# Patient Record
Sex: Female | Born: 1945 | Race: White | Hispanic: No | Smoking: Former smoker
Health system: Southern US, Community
[De-identification: ages and names within clinical notes are randomized; demographics above are authoritative.]

## PROBLEM LIST (undated history)

## (undated) DIAGNOSIS — I10 Essential (primary) hypertension: Secondary | ICD-10-CM

## (undated) DIAGNOSIS — M24569 Contracture, unspecified knee: Secondary | ICD-10-CM

## (undated) DIAGNOSIS — I2510 Atherosclerotic heart disease of native coronary artery without angina pectoris: Secondary | ICD-10-CM

## (undated) DIAGNOSIS — E559 Vitamin D deficiency, unspecified: Secondary | ICD-10-CM

## (undated) DIAGNOSIS — L89159 Pressure ulcer of sacral region, unspecified stage: Secondary | ICD-10-CM

## (undated) DIAGNOSIS — G8310 Monoplegia of lower limb affecting unspecified side: Secondary | ICD-10-CM

## (undated) DIAGNOSIS — M810 Age-related osteoporosis without current pathological fracture: Secondary | ICD-10-CM

## (undated) DIAGNOSIS — N3946 Mixed incontinence: Secondary | ICD-10-CM

## (undated) DIAGNOSIS — R29898 Other symptoms and signs involving the musculoskeletal system: Secondary | ICD-10-CM

## (undated) DIAGNOSIS — I639 Cerebral infarction, unspecified: Secondary | ICD-10-CM

## (undated) DIAGNOSIS — S82309A Unspecified fracture of lower end of unspecified tibia, initial encounter for closed fracture: Secondary | ICD-10-CM

## (undated) DIAGNOSIS — E7800 Pure hypercholesterolemia, unspecified: Secondary | ICD-10-CM

## (undated) DIAGNOSIS — I350 Nonrheumatic aortic (valve) stenosis: Secondary | ICD-10-CM

## (undated) DIAGNOSIS — S82839A Other fracture of upper and lower end of unspecified fibula, initial encounter for closed fracture: Secondary | ICD-10-CM

## (undated) DIAGNOSIS — G35 Multiple sclerosis: Secondary | ICD-10-CM

## (undated) HISTORY — DX: Contracture, unspecified knee: M24.569

## (undated) HISTORY — DX: Cerebral infarction, unspecified: I63.9

## (undated) HISTORY — DX: Age-related osteoporosis without current pathological fracture: M81.0

## (undated) HISTORY — DX: Unspecified fracture of lower end of unspecified tibia, initial encounter for closed fracture: S82.309A

## (undated) HISTORY — DX: Atherosclerotic heart disease of native coronary artery without angina pectoris: I25.10

## (undated) HISTORY — DX: Multiple sclerosis: G35

## (undated) HISTORY — DX: Other symptoms and signs involving the musculoskeletal system: R29.898

## (undated) HISTORY — PX: CORONARY STENT PLACEMENT: SHX1402

## (undated) HISTORY — DX: Essential (primary) hypertension: I10

## (undated) HISTORY — DX: Other fracture of upper and lower end of unspecified fibula, initial encounter for closed fracture: S82.839A

## (undated) HISTORY — DX: Nonrheumatic aortic (valve) stenosis: I35.0

## (undated) HISTORY — DX: Monoplegia of lower limb affecting unspecified side: G83.10

## (undated) HISTORY — DX: Pressure ulcer of sacral region, unspecified stage: L89.159

## (undated) HISTORY — DX: Pure hypercholesterolemia, unspecified: E78.00

## (undated) HISTORY — DX: Vitamin D deficiency, unspecified: E55.9

## (undated) HISTORY — DX: Mixed incontinence: N39.46

---

## 1989-11-24 DIAGNOSIS — G35 Multiple sclerosis: Secondary | ICD-10-CM

## 1989-11-24 HISTORY — DX: Multiple sclerosis: G35

## 2006-05-14 ENCOUNTER — Ambulatory Visit: Payer: Self-pay | Admitting: Obstetrics and Gynecology

## 2006-12-16 ENCOUNTER — Encounter: Payer: Self-pay | Admitting: General Practice

## 2006-12-25 ENCOUNTER — Encounter: Payer: Self-pay | Admitting: General Practice

## 2007-01-23 ENCOUNTER — Encounter: Payer: Self-pay | Admitting: General Practice

## 2007-02-23 ENCOUNTER — Encounter: Payer: Self-pay | Admitting: General Practice

## 2007-03-16 ENCOUNTER — Emergency Department: Payer: Self-pay | Admitting: Emergency Medicine

## 2007-03-25 ENCOUNTER — Emergency Department: Payer: Self-pay | Admitting: Emergency Medicine

## 2007-03-28 ENCOUNTER — Emergency Department: Payer: Self-pay | Admitting: General Practice

## 2007-06-07 ENCOUNTER — Encounter: Payer: Self-pay | Admitting: Orthopaedic Surgery

## 2007-06-25 ENCOUNTER — Encounter: Payer: Self-pay | Admitting: Orthopaedic Surgery

## 2007-08-30 IMAGING — CR DG SHOULDER 1V*L*
1 series · 1 of 1 positions shown · non-contrast
Comparison: none

REASON FOR EXAM: trauma  pain in shoulder  [HOSPITAL]
COMMENTS:

[view not recorded]
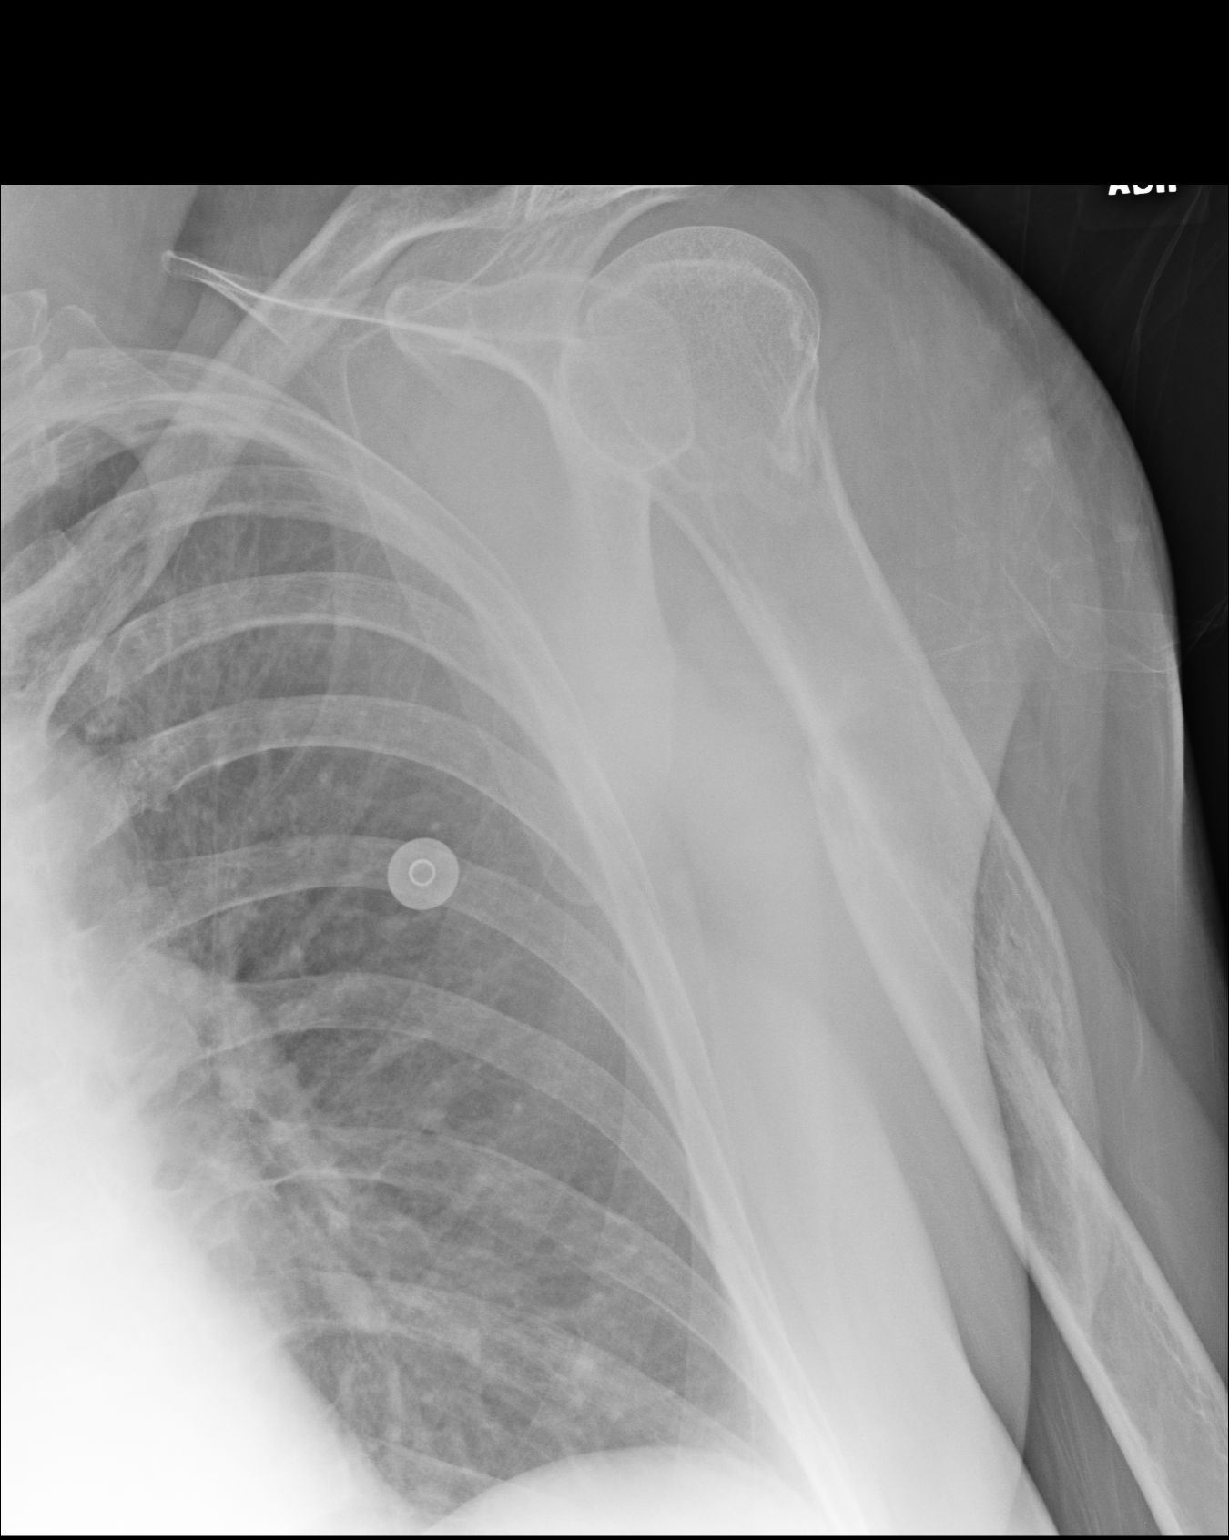

[1 of 1 positions shown; findings below may reference images not displayed]

PROCEDURE:     DXR - DXR SHOULDER LEFT ONE VIEW  - March 16, 2007  [DATE]

RESULT:     Views of the LEFT shoulder show diffuse osteopenia. Only a
single view is obtained. There is deformity in the midportion of the LEFT
humerus consistent with fracture. The chronicity of this and the degree of
healing is difficult to assess on this single view. Additional views, at
least a transthoracic lateral, would be helpful. The humeral head appears to
be intact and located in the glenoid.
IMPRESSION: 1.Deformity of the humerus. Please see above.

## 2007-08-30 IMAGING — CR DG HUMERUS 2V *L*
1 series · 2 of 2 positions shown · non-contrast
Comparison: none

REASON FOR EXAM: pain
COMMENTS:

[Series 1: view not recorded · 0.17mm/px · 2 of 2 slices shown]
[im 1/2]
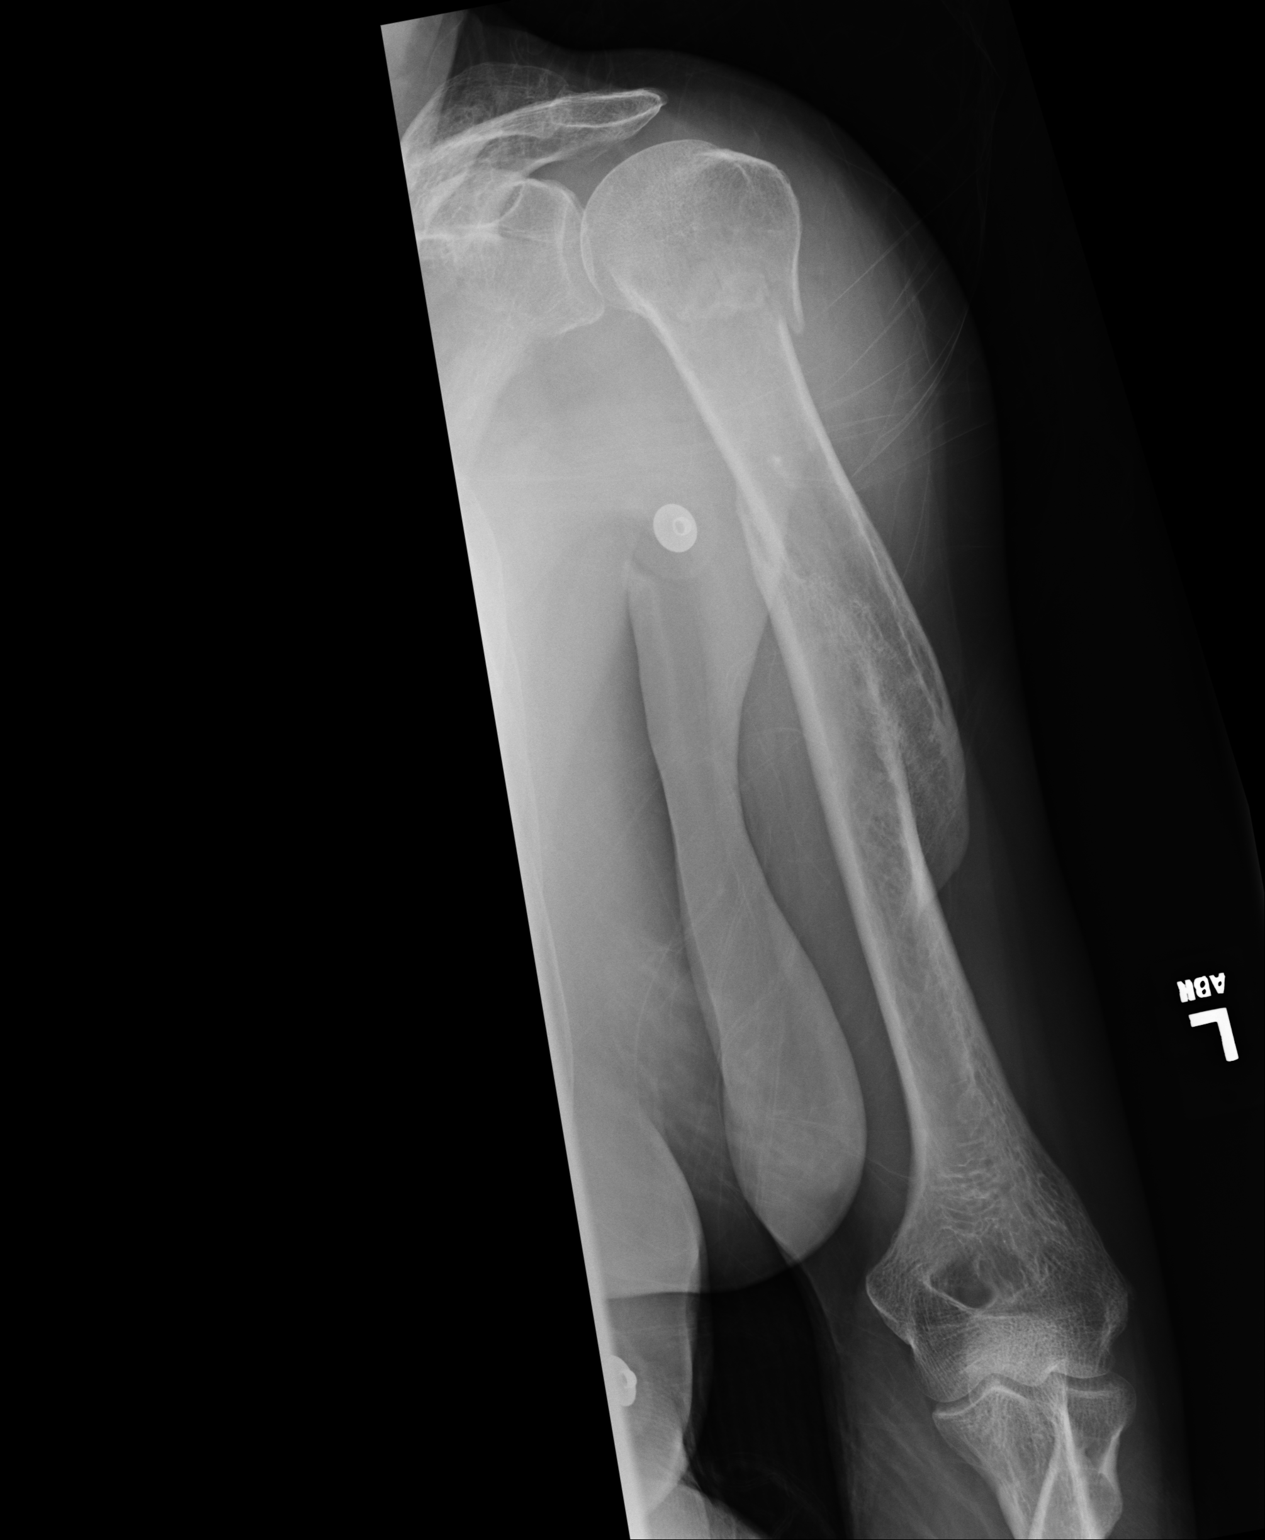
[im 2/2]
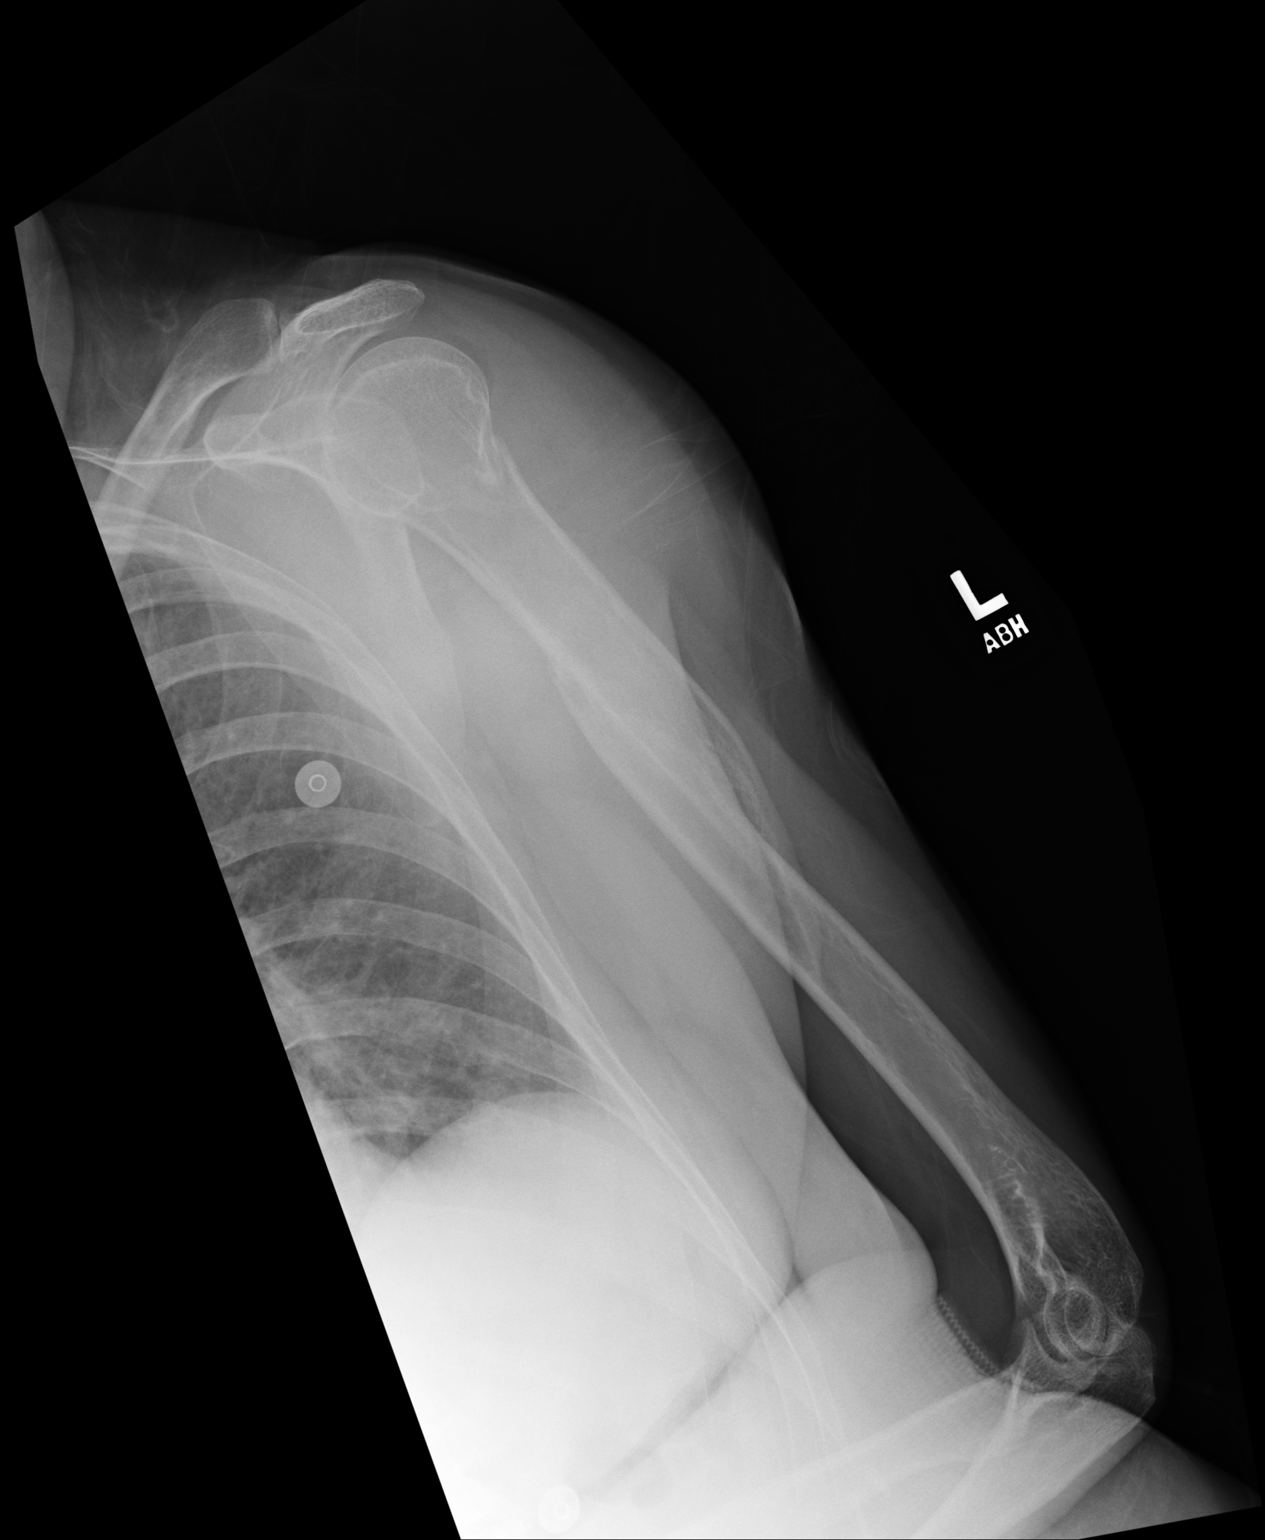

[2 of 2 positions shown; findings below may reference images not displayed]

PROCEDURE:     DXR - DXR HUMERUS LEFT  - March 16, 2007  [DATE]

RESULT:     Two views of the LEFT humerus show a fracture in the surgical
neck of the LEFT humerus. There is deformity of the midshaft which may be
secondary to old fracture with healing. Previous films on 04/28/1998 showed
healed old fracture in the midshaft of the LEFT humerus.
IMPRESSION: 1.Non-displaced proximal humeral fracture with old healed midshaft LEFT
humeral fracture. No significant comminution or angulation is present. No
significant impaction is demonstrated.

## 2010-01-25 ENCOUNTER — Ambulatory Visit: Payer: Self-pay | Admitting: Family Medicine

## 2012-11-12 ENCOUNTER — Ambulatory Visit: Payer: Self-pay | Admitting: Family Medicine

## 2012-12-08 ENCOUNTER — Ambulatory Visit: Payer: Self-pay | Admitting: Family Medicine

## 2013-04-25 ENCOUNTER — Ambulatory Visit: Payer: Self-pay | Admitting: Surgery

## 2013-11-24 DIAGNOSIS — I639 Cerebral infarction, unspecified: Secondary | ICD-10-CM

## 2013-11-24 HISTORY — DX: Cerebral infarction, unspecified: I63.9

## 2014-03-03 IMAGING — US ULTRASOUND RIGHT BREAST
1 series · 2 of 2 positions shown · non-contrast
Comparison: none

REASON FOR EXAM: av rt focal asymmetry
COMMENTS:

PROCEDURE:     US  - US BREAST RIGHT  - December 08, 2012 [DATE]
RESULT:     TECHNIQUE: Digital diagnostic right mammograms were obtained.
FDA approved computer-aided detection (CAD) for mammography was utilized for
this study.

[Series 1: ultrasound right breast · 0.08mm/px · 2 of 2 slices shown]
[im 1/2]
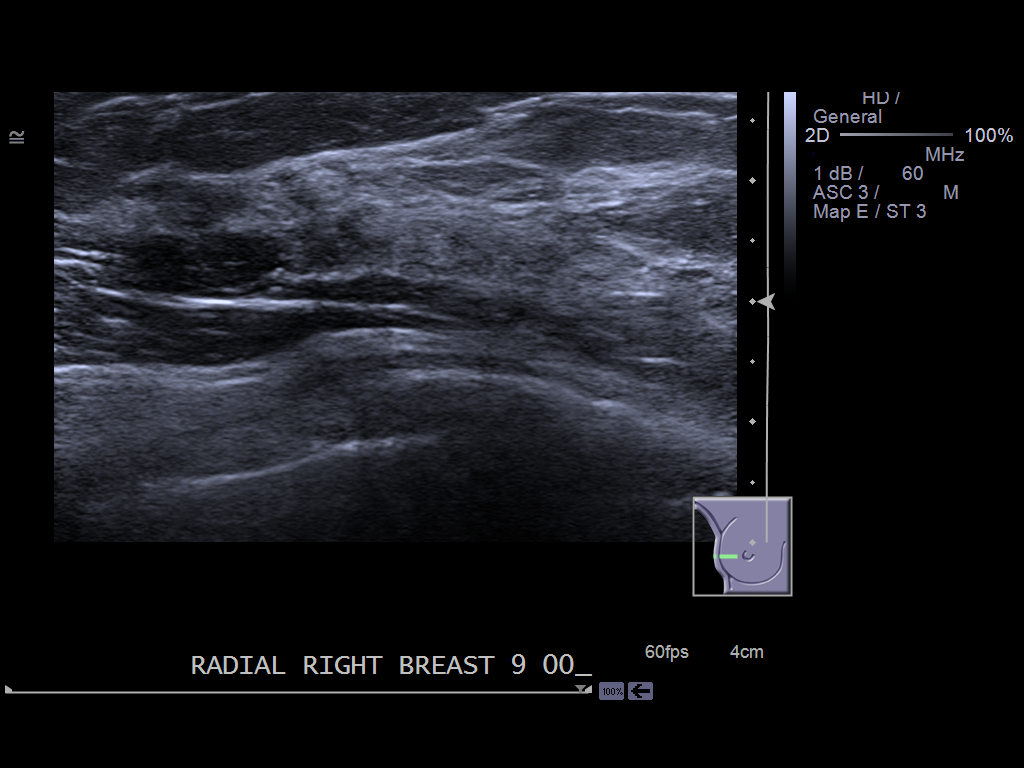
[im 2/2]
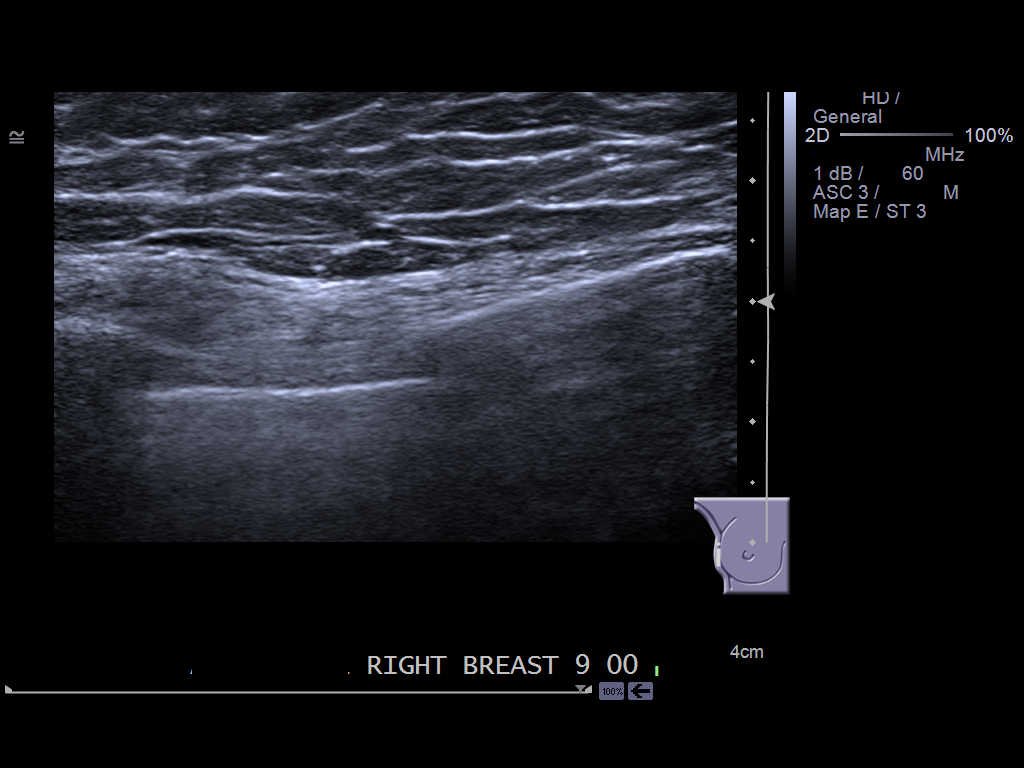

[2 of 2 positions shown; findings below may reference images not displayed]

FINDING: True lateral view and spot compression views of the right breast were
performed. There is a focal nodular asymmetry in the upper outer posterior
third of the right breast which is new compared with 01/25/2010. There is no
architectural distortion or clusters of suspicious microcalcifications.

Real-time sonography of the upper outer right breast was performed. There is
no solid or cystic mass.
IMPRESSION: 1. Small focal nodular asymmetry in the upper outer posterior third of the
right breast new compared with 01/25/2010. There is no sonographic correlate.
Recommend tissue diagnosis with stereotactic biopsy.

BI-RADS: Category 4 - Suspicious Abnormality

A negative mammogram report does not preclude biopsy or other evaluation of
a clinically palpable or otherwise suspicious mass or lesion. Breast cancer
may not be detected by mammography in up to 10% of cases.

[REDACTED]

## 2014-04-10 DIAGNOSIS — M810 Age-related osteoporosis without current pathological fracture: Secondary | ICD-10-CM

## 2014-04-10 DIAGNOSIS — I10 Essential (primary) hypertension: Secondary | ICD-10-CM | POA: Insufficient documentation

## 2014-04-10 DIAGNOSIS — E559 Vitamin D deficiency, unspecified: Secondary | ICD-10-CM

## 2014-04-10 DIAGNOSIS — E7800 Pure hypercholesterolemia, unspecified: Secondary | ICD-10-CM

## 2014-04-10 HISTORY — DX: Vitamin D deficiency, unspecified: E55.9

## 2014-04-10 HISTORY — DX: Essential (primary) hypertension: I10

## 2014-04-10 HISTORY — DX: Pure hypercholesterolemia, unspecified: E78.00

## 2014-04-10 HISTORY — DX: Age-related osteoporosis without current pathological fracture: M81.0

## 2014-06-06 ENCOUNTER — Ambulatory Visit: Payer: Self-pay | Admitting: Surgery

## 2014-07-11 DIAGNOSIS — G35 Multiple sclerosis: Secondary | ICD-10-CM | POA: Insufficient documentation

## 2014-07-11 DIAGNOSIS — R29898 Other symptoms and signs involving the musculoskeletal system: Secondary | ICD-10-CM | POA: Insufficient documentation

## 2014-07-11 HISTORY — DX: Other symptoms and signs involving the musculoskeletal system: R29.898

## 2014-09-13 DIAGNOSIS — M24569 Contracture, unspecified knee: Secondary | ICD-10-CM

## 2014-09-13 DIAGNOSIS — G8310 Monoplegia of lower limb affecting unspecified side: Secondary | ICD-10-CM

## 2014-09-13 HISTORY — DX: Monoplegia of lower limb affecting unspecified side: G83.10

## 2014-09-13 HISTORY — DX: Contracture, unspecified knee: M24.569

## 2015-04-25 ENCOUNTER — Ambulatory Visit: Payer: Medicare Other | Attending: Neurology

## 2015-04-25 DIAGNOSIS — G35 Multiple sclerosis: Secondary | ICD-10-CM | POA: Insufficient documentation

## 2015-04-25 DIAGNOSIS — R262 Difficulty in walking, not elsewhere classified: Secondary | ICD-10-CM | POA: Insufficient documentation

## 2015-04-25 DIAGNOSIS — M6281 Muscle weakness (generalized): Secondary | ICD-10-CM

## 2015-04-25 NOTE — Therapy (Addendum)
[place] MAIN [facility] SERVICES [address] [place], [place], [address] Phone: [phone]   Fax:  [phone]  Physical Therapy Evaluation  Patient Details  Name: Hannah Hines MRN: [id number] Date of Birth: 09/23/1946 Referring Provider:  Anabel Bene, MD  Encounter Date: 04/25/2015      PT End of Session - 04/25/15 1610    Visit Number 1   Number of Visits 1   Date for PT Re-Evaluation 05/02/15   PT Start Time 9024   PT Stop Time 1520   PT Time Calculation (min) 75 min   Equipment Utilized During Treatment Gait belt   Activity Tolerance Patient tolerated treatment well   Behavior During Therapy [facility] for tasks assessed/performed      Past Medical History  Diagnosis Date  . Multiple sclerosis 1991  . Hypertension   . Stroke 2015    questionable    Past Surgical History  Procedure Laterality Date  . Coronary stent placement N/A     There were no vitals filed for this visit.  Visit Diagnosis:  Difficulty walking  Muscle weakness      Subjective Assessment - 04/25/15 1559    Subjective pt reports she was diagnosed with Multiple sclerosis in 1991, but she was independent with mobility in the home using a cane up until a year ago. she reports she fell and broke her ankle, and then fell again in the shower around 6 months ago. pt reports since then she has had poor strength and control of the RLE and RUE. pt reports the doctors thinks she may have had a stroke at that time as she has right sided weakness. pt reports since then she has not been able to walk at all and has been confided to a chair/bed in her home. pt reports she can transfer with a great deal of UE support, but still falls at times. pt reports she has an old power chair of her mothers that she has used in the past but it is broken. pt reports she has a manual transport chair at home that got 6 months ago, but she cannot propel it well enough to get around in her home  adequately.    Patient is accompained by: Family member  daughter shann   Limitations Sitting;Standing;Walking;House hold activities   Patient Stated Goals maximize independence   Currently in Pain? Yes   Pain Score 5    Pain Location Arm   Pain Orientation Right;Left   Pain Descriptors / Indicators Aching   Pain Type Chronic pain            [facility] PT Assessment - 04/25/15 0001    Assessment   Medical Diagnosis MS   Onset Date/Surgical Date 04/24/90   Hand Dominance Right   Next MD Visit 06/2015   Prior Therapy [id number] PT   Precautions   Precautions Fall   Balance Screen   Has the patient fallen in the past 6 months Yes   How many times? 3   Has the patient had a decrease in activity level because of a fear of falling?  Yes   Is the patient reluctant to leave their home because of a fear of falling?  Yes   [place] entrance  chair [place] Two level   Alternate Level Stairs-Number  of Steps --  12- pt has chair [place] - single point;Wheelchair - [employer] - 2 wheels;Shower seat;Grab bars - toilet;Grab bars - tub/shower  chair lift, stair lift,    Prior Function   Level of Independence Independent with household mobility with device;Requires assistive device for independence   Vocation Retired   Charity fundraiser Status Within Functional Limits for tasks assessed   Attention Focused   Memory Appears intact   Awareness Appears intact   Observation/Other Assessments   Observations posterior pelvic tilt, kyphosis    Skin Integrity --  redness over sacrum   Sensation   Light Touch Impaired by gross assessment  feet   Transfers   Transfers Sit to [employer];Lateral/Scoot Transfers   Sit to Stand 4: Min assist;6: Modified independent (Device/Increase time);From elevated surface;With  upper extremity assist   Squat Pivot Transfers 4: Min assist;3: Mod assist;1: +1 Total assist;6: Modified independent (Device/Increase time);From elevated surface;With armrests   Lateral/Scoot Transfers 6: Modified independent (Device/Increase time)   Ambulation/Gait   Ambulation/Gait Yes   Ambulation/Gait Assistance Not tested (comment)  unable to ambulate, R leg flexed at knee, unable to straight   Balance   Balance Assessed Yes   Static Sitting Balance   Static Sitting - Balance Support Right upper extremity supported;Left upper extremity supported;Feet supported;Feet unsupported   Static Sitting - Level of Assistance 5: Stand by assistance   Dynamic Sitting Balance   Dynamic Sitting - Balance Support Left upper extremity supported;Feet supported   Dynamic Sitting - Level of Assistance 5: Stand by assistance   Reach (Patient is able to reach ___ inches to right, left, forward, back) 8   Static Standing Balance   Static Standing - Balance Support Left upper extremity supported;Right upper extremity supported   Static Standing - Level of Assistance 5: Stand by assistance   Static Standing - Comment/# of Minutes --  1 min, heavy reliance on RW         Please See Mobility/seating evaluation  Mobility/Seating Evaluation    PATIENT INFORMATION: Name: Hannah Hines, Hannah Hines DOB: 1946-04-20  Sex: f Date seen: 04/25/15 Time: 2:00PM  Address:  [place] [address] Physician: Lyndel Pleasure This evaluation/justification form will serve as the LMN for the following suppliers: __________________________ Supplier: [employer] Contact Person: Serafina Royals Phone:  [phone]   Seating Therapist: Alease Medina PT, DPT Phone:   [phone]   Phone: ?????    Spouse/Parent/Caregiver name: Lewanda Rife (daughter)  Phone number: [phone] Insurance/Payer: Medicare     Reason for Referral: mobiliy assessment for power wheelchair   Patient/Caregiver Goals: maximize independence    Patient was seen for face-to-face evaluation for new power wheelchair.  Also present was Serafina Royals, [employer] to discuss recommendations and wheelchair options.  Further paperwork was completed and sent to vendor.  Patient appears to qualify for power mobility device at this time per objective findings.   MEDICAL HISTORY: Diagnosis: Primary Diagnosis: MS Onset: 1991 Diagnosis: ?????   _0 Progressive Disease Relevant past and future surgeries: ?????   Height: 5'6" Weight: 177 Explain recent changes or trends in weight: slight increase since not being mobile   History including Falls: yes    HOME ENVIRONMENT: _1 House  _2 Condo/town home  _3 Apartment  _4 Assisted Living    _5 Lives Alone _6  Lives with Others  Hours with caregiver: ?????  _0 Home is accessible to patient           Stairs      _1 Yes _2  No     Ramp _3 Yes _4 No Comments:  chair lift inside home   COMMUNITY ADL: TRANSPORTATION: _5 Car    _6 Van    <KVQQVZDGLOVFIEPP>_2<\/RJJOACZYSAYTKZSW>_1 Public Transportation    _8 Adapted w/c Lift    _9 Ambulance    _10 Other:       _11 Sits in wheelchair during transport  Employment/School: retired Specific requirements pertaining to mobility ?????  Other: ?????    FUNCTIONAL/SENSORY PROCESSING SKILLS:  Handedness:   _12 Right     _13 Left    _14 NA  Comments:  ?????  Functional Processing Skills for Wheeled Mobility _15 Processing Skills are adequate for safe wheelchair operation  Areas of concern than may interfere with safe operation of wheelchair Description of problem   _16  Attention to environment      _17 Judgment      _18  Hearing  _19  Vision or visual processing      _20 Motor Planning  _21  Fluctuations in Behavior  none    VERBAL COMMUNICATION: _22 WFL receptive _23  WFL expressive _24 Understandable  _25 Difficult to understand  _26 non-communicative _27  Uses an augmented communication device  CURRENT SEATING / MOBILITY: Current Mobility Base:  _28 None  _29 Dependent _30 Manual _31 Scooter _32 Power  Type of Control: ?????  Manufacturer:  ?????Size:  ?????Age: ?????  Current Condition of Mobility Base:  ?????   Current Wheelchair components:  ?????  Describe posture in present seating system:  ?????      SENSATION and SKIN ISSUES: Sensation _33 Intact  _34 Impaired _35 Absent  Level of sensation: reduced sensation in the feet  Pressure Relief: Able to perform effective pressure relief :    _36 Yes  _37  No Method: weight shift If not, Why?: impaired ability to weight shift in sitting, inadequate pressure relief <10s push up from chair   Skin Issues/Skin Integrity Current Skin Issues  _38 Yes _39 No _40 Intact _41  Red area_42  Open Area  _43 Scar Tissue _44 At risk from prolonged sitting Where  sacrum  History of Skin Issues  _45 Yes _46 No Where  newly an issue within past 91moWhen  ?????  Hx of skin flap surgeries  _47 Yes _48 No Where  ????? When  ?????  Limited sitting tolerance _49 Yes _50 No Hours spent sitting in wheelchair daily: 8+  Complaint of Pain:  Please describe: aching of the arms  5/10   Swelling/Edema: no   ADL STATUS (in reference to wheelchair use):  Indep Assist Unable Indep with Equip Not assessed Comments  Dressing ????? ????? ????? x ????? ?????  Eating x ????? ????? ????? ????? ?????  Toileting ????? ????? ????? x ????? ?????  Bathing ????? x ????? ????? ????? ?????  Grooming/Hygiene x ????? ????? ????? ????? ?????  Meal Prep ????? ????? ????? x ????? ?????  IADLS ????? ????? ????? x ????? ?????  Bowel Management: _51 Continent  _52 Incontinent  _53 Accidents Comments:  ?????  Bladder Management: _54 Continent  _55 Incontinent  _56 Accidents Comments:  wears depends     WHEELCHAIR SKILLS: Manual w/c Propulsion: _57 UE or LE strength and endurance sufficient to participate in ADLs using manual wheelchair Arm : _58 left _59 right   _60 Both      Distance: ????? Foot:  _61 left _62 right   _63 Both  Operate Scooter: _64  Strength, hand grip, balance and  transfer appropriate for use _65 Living environment is accessible for use of scooter  Operate Power w/c:  _66  Std. Joystick   _67  Alternative Controls Indep _68  Assist _69  Dependent/unable _70  N/A _71   _72   Safe          _0  Functional      Distance: ?????  Bed confined without wheelchair _1  Yes _2  No   STRENGTH/RANGE OF MOTION:  ????? Range of Motion Strength  Shoulder impaired to 90 deg, flexion/ abduction  R 4-/5, L 3-/5  Elbow WNL 4/5 R, 3+/5 L  Wrist/Hand impaired AROM of the R hand 5lbs grip on the R, 20 lbs on the L  Hip Limited AROM on the R 2-/5 on the R hip flexion, 4/5 L hip flexion   Knee R knee lacking 30 deg passive and active extension, L knee WNL R knee 2-/5, L knee 4/5  Ankle R ankle 0 deg dorsiflexion, 25 deg plantar flexion , L ankle  WNL R ankle 2-/5, L ankle 4+/5     MOBILITY/BALANCE:  _3  Patient is totally dependent for mobility  ?????    Balance Transfers Ambulation  Sitting Balance: Standing Balance: _4  Independent _5  Independent/Modified Independent  _6  WFL     _7  WFL _8  Supervision _9  Supervision  _10  Uses UE for balance  _11  Supervision _12  Min Assist _13  Ambulates with Assist  ?????    _14  Min Assist _15  Min assist _16  Mod Assist _17  Ambulates with Device:      _18  RW  _19  StW  _20  Cane  _21  ?????  _22  Mod Assist _23  Mod assist _24  Max assist   _25  Max Assist _26  Max assist _27  Dependent _28  Indep. Short Distance Only  _29  Unable _30  Unable _31  Lift / Sling Required Distance (in feet)  ?????   _32  Sliding board _33  Unable to Ambulate (see explanation below)  Cardio Status:  _34 Intact  _35  Impaired   _36  NA     ?????  Respiratory Status:  _37 Intact   _38 Impaired   _39 NA     ?????  Orthotics/Prosthetics: ?????  Comments (Address manual vs power w/c vs scooter): pt has insufficent arm strength to propel manual WC, pt has insufficent arm/grip strength and impaired sitting balance which would not be safe to use scooter.         Anterior / Posterior Obliquity Rotation-Pelvis ?????   PELVIS    _40  _41  _42   Neutral Posterior Anterior  _43  _44  _45   WFL Rt elev Lt elev  _46  _47  _48   WFL Right Left                      Anterior    Anterior     _49  Fixed _50  Other _51  Partly Flexible _52  Flexible   _53  Fixed _54  Other _55  Partly Flexible  _56  Flexible  _57  Fixed _58  Other _59  Partly Flexible  _60  Flexible   TRUNK  _61  _62  _63   WFL ? Thoracic ? Lumbar  Kyphosis Lordosis  _64  _65  _66   [facility] Convex Convex  Right Left _67 c-curve _68 s-curve _69 multiple  _70  Neutral _71  Left-anterior _72  Right-anterior     _73  Fixed _74  Flexible _75  Partly Flexible _76  Other  _77  Fixed _78  Flexible _79  Partly Flexible _80  Other  _81  Fixed             _82  Flexible _83  Partly Flexible _84  Other    Position Windswept  ?????  HIPS          _85            _86               _87    Neutral       Abduct  ADduct         _0           _1            _2   Neutral Right           Left      _3  Fixed _4  Subluxed _5  Partly Flexible _6  Dislocated _7  Flexible  _8  Fixed _9  Other _10  Partly Flexible  _11  Flexible                 Foot Positioning Knee Positioning  ?????    _12  WFL  _13 Lt _14 Rt _15  WFL  _16 Lt _17 Rt    KNEES ROM concerns: ROM concerns:    & Dorsi-Flexed _18 Lt _19 Rt ?????    FEET Plantar Flexed _20 Lt _21 Rt      Inversion                 _22 Lt _23 Rt      Eversion                 _24 Lt _25 Rt     HEAD _26  Functional _27  Good Head Control  ?????  & _28  Flexed         _29  Extended _30  Adequate Head Control    NECK _31  Rotated  Lt  _32  Lat Flexed Lt _33  Rotated  Rt _34  Lat Flexed Rt _35  Limited Head Control     _36  Cervical Hyperextension _37  Absent  Head Control     SHOULDERS ELBOWS WRIST& HAND ?????      Left     Right    Left     Right    Left     Right   U/E _38 Functional           _39 Functional ????? ????? _40 Fisting             _41 Fisting      _42 elev   _43 dep      _44 elev   _45 dep       _46 pro -_47 retract     _48 pro  _49 retract _50 subluxed             _51 subluxed           Goals for Wheelchair Mobility  _52  Independence with  mobility in the home with motor related ADLs (MRADLs)  _53  Independence with MRADLs in the community _54  Provide dependent mobility  _55  Provide recline     _56 Provide tilt   Goals for Seating system _57  Optimize pressure distribution _58  Provide support needed to facilitate function or safety _59  Provide corrective forces to assist with maintaining or improving posture _60  Accommodate client's posture:   current seated postures and positions are not flexible or will not tolerate corrective forces _61  Client to be independent with relieving pressure in the wheelchair _62 Enhance physiological function such as breathing, swallowing, digestion  Simulation ideas/Equipment trials:????? State why other equipment was unsuccessful:?????   MOBILITY BASE RECOMMENDATIONS and JUSTIFICATION: MOBILITY COMPONENT JUSTIFICATION  Manufacturer: [employer] Model: pulse 6   Size: Width 16 Seat Depth 18 _63 provide transport from point A to B      _64 promote Indep mobility  _65 is not a safe, functional ambulator _66 walker or cane inadequate _67 non-standard width/depth necessary to accommodate anatomical measurement _68  ?????  _69 Manual Mobility Base _70 non-functional ambulator    _71 Scooter/POV  _72 can safely operate  _73 can safely transfer   _74 has adequate trunk stability  _75 cannot functionally propel manual w/c  _76 Power Mobility Base  _77 non-ambulatory  _78 cannot functionally propel manual wheelchair  _79  cannot functionally and safely operate  scooter/POV _0 can safely operate and willing to  _1 Stroller Base _2 infant/child  _3 unable to propel manual wheelchair _4 allows for growth _5 non-functional ambulator _6 non-functional UE _7 Indep mobility is not a goal at this time  _8 Tilt  _9 Forward _10 Backward _11 Powered tilt  _12 Manual tilt  _13 change position against gravitational force on head and shoulders  _14 change position for pressure relief/cannot weight shift _15 transfers  _16 management of tone _17 rest  periods _18 control edema _19 facilitate postural control  _20  ?????  _21 Recline  _22 Power recline on power base _23 Manual recline on manual base  _24 accommodate femur to back angle  _25 bring to full recline for ADL care  _26 change position for pressure relief/cannot weight shift _27 rest periods _28 repositioning for transfers or clothing/diaper /catheter changes _29 head positioning  _30 Lighter weight required _31 self- propulsion  _32 lifting _33  ?????  _34 Heavy Duty required _35 user weight greater than 250# _36 extreme tone/ over active movement _37 broken frame on previous chair _38  ?????  _39  Back  _40  Angle Adjustable _41  Custom molded ASAP II _42 postural control _43 control of tone/spasticity _44 accommodation of range of motion _45 UE functional control _46 accommodation for seating system _47  ????? _48 provide lateral trunk support _49 accommodate deformity _50 provide posterior trunk support _51 provide lumbar/sacral support _52 support trunk in midline _53 Pressure relief over spinal processes  _54  Seat [place] M2 Incontinent liner  _55 impaired sensation  _56 decubitus ulcers present _57 history of pressure ulceration _58 prevent pelvic extension _59 low maintenance  _60 stabilize pelvis  _61 accommodate obliquity _62 accommodate multiple deformity _63 neutralize lower extremity position _64 increase pressure distribution _65  skin redness present X protect the seat cushion   _66  Pelvic/thigh support  _67  Lateral thigh guide _68  Distal medial pad  _69  Distal lateral pad _70  pelvis in neutral _71 accommodate pelvis _72  position upper legs _73  alignment _74  accommodate ROM _75  decr adduction _76 accommodate tone _77 removable for transfers _78 decr abduction  _79  Lateral trunk Supports _80  Lt     _81  Rt _82 decrease lateral trunk leaning _83 control tone _84 contour for increased contact _85 safety  _86 accommodate asymmetry _87  ?????  _88  Mounting hardware  _89 lateral trunk supports  _90 back   _91 seat _92 headrest      _93  thigh  support _94 fixed   _95 swing away _96 attach seat platform/cushion to w/c frame _97 attach back cushion to w/c frame _98 mount postural supports _99 mount headrest  _100 swing medial thigh support away _101 swing lateral supports away for transfers  _102  ?????    Armrests  _103 fixed _104 adjustable height _105 removable   _106 swing away  _107 flip back   _108 reclining _109 full length pads _110 desk    _111 pads tubular  _112 provide support with elbow at 90   _113 provide support for w/c tray _114 change of height/angles for variable activities _115 remove for transfers _116 allow to come closer to table top _117 remove for access to tables _118  ?????  Hangers/ Leg rests  _119 60 _120 70 _121 90 _122 elevating _123 heavy duty  _124 articulating _125 fixed _126 lift off _127 swing away     _128 power _129 provide LE support  _130 accommodate to hamstring tightness _131 elevate legs during recline   _132 provide change in position for Legs _133 Maintain placement of feet on footplate _134 durability _135 enable transfers _136 decrease edema _137 Accommodate lower leg length _138  ?????  Foot support Footplate    <ACZYSAYTKZSWFUXN>_2<\/TFTDDUKGURKYHCWC>_376 Lt  _140  Rt  _141  Center mount _142 flip up     _143 depth/angle adjustable _144 Amputee adapter    _145  Lt     _146  Rt _147 provide foot support _148 accommodate to ankle ROM _149 transfers _150 Provide support for residual extremity _151  allow foot to go under wheelchair base _152  decrease tone  _153  ?????  _154  Ankle strap/heel loops _155 support foot on foot support _156 decrease extraneous movement _157 provide input to heel  _158 protect foot  Tires: [  x]pneumatic  _0 flat free inserts  _1 solid  _2 decrease maintenance  _3 prevent frequent flats _4 increase shock absorbency _5 decrease pain from road shock _6 decrease spasms from road shock _7  ?????  _8  Headrest  _9 provide posterior head support _10 provide posterior neck support _11 provide lateral head support _12 provide anterior head support _13 support during tilt and recline _14 improve feeding   _15 improve respiration _16 placement of  switches _17 safety  _18 accommodate ROM  _19 accommodate tone _20 improve visual orientation  _21  Anterior chest strap _22  Vest _23  Shoulder retractors  _24 decrease forward movement of shoulder _25 accommodation of TLSO _26 decrease forward movement of trunk _27 decrease shoulder elevation _28 added abdominal support _29 alignment _30 assistance with shoulder control  _31  ?????  Pelvic Positioner _32 Belt _33 SubASIS bar _34 Dual Pull _35 stabilize tone _36 decrease falling out of chair/ **will not Decr potential for sliding due to pelvic tilting _37 prevent excessive rotation _38 pad for protection over boney prominence _39 prominence comfort _40 special pull angle to control rotation _41  ?????  Upper Extremity Support _42 L   _43  R _44 Arm trough    _45 hand support _46  tray       _47 full tray _48 swivel mount _49 decrease edema      _50 decrease subluxation   _51 control tone   _52 placement for AAC/Computer/EADL _53 decrease gravitational pull on shoulders _54 provide midline positioning _55 provide support to increase UE function _56 provide hand support in natural position _57 provide work surface   POWER WHEELCHAIR CONTROLS  _58 Proportional  _59 Non-Proportional Type ????? _60 Left  _61 Right _62 provides access for controlling wheelchair   _63 lacks motor control to operate proportional drive control <KZSWFUXNATFTDDUK>_0<\/URKYHCWCBJSEGBTD>_17 unable to understand proportional controls  Actuator Control Module  _65 Single  _66 Multiple   _67 Allow the client to operate the power seat function(s) through the joystick control   _68 Safety Reset Switches _69 Used to change modes and stop the wheelchair when driving in latch mode    _70 Upgraded Electronics   _71 programming for accurate control _72 progressive Disease/changing condition _73 non-proportional drive control needed _74 Needed in order to operate power seat functions through joystick control   _75 Display box _76 Allows user to see in which mode and drive the wheelchair is set  _77 necessary for alternate controls    _78 Digital interface  electronics _79 Allows w/c to operate when using alternative drive controls  <OHYWVPXTGGYIRSWN>_4<\/OEVOJJKKXFGHWEXH>_37 ASL Head Array _81 Allows client to operate wheelchair  through switches placed in tri-panel headrest  _82 Sip and puff with tubing kit _83 needed to operate sip and puff drive controls  <JIRCVELFYBOFBPZW>_2<\/HENIDPOEUMPNTIRW>_43 Upgraded tracking electronics _85 increase safety when driving <XVQMGQQPYPPJKDTO>_6<\/ZTIWPYKDXIPJASNK>_53 correct tracking when on uneven surfaces  _87 [facility] for switches or joystick _88 Attaches switches to w/c  _89 Swing away for access or transfers _90 midline for optimal placement _91 provides for consistent access  _92 Attendant controlled joystick plus mount _93 safety _94 long distance driving <ZJQBHALPFXTKWIOX>_7<\/DZHGDJMEQASTMHDQ>_22 operation of seat functions _96 compliance with transportation regulations _97  ?????    Rear wheel placement/Axle adjustability _98 None _99 semi adjustable _100 fully adjustable  _101 improved UE access to wheels _102 improved stability _103 changing angle in space for improvement of postural stability _104 1-arm drive access <WLNLGXQJJHERDEYC>_1<\/KGYJEHUDJSHFWYOV>_785 amputee pad placement _106  ?????  Wheel rims/ hand rims  _107 metal  _108 plastic coated _109 oblique projections _110 vertical projections _111 Provide ability to propel manual wheelchair  _112  Increase self-propulsion with hand weakness/decreased grasp  Push handles _113 extended  _114 angle adjustable  _115 standard _116 caregiver access _117 caregiver assist _118 allows "hooking" to enable increased ability to perform ADLs or maintain balance  One armed device  _119 Lt   _120 Rt _121 enable propulsion of manual wheelchair with one arm   _122  ?????   Brake/wheel lock extension _123  Lt   _124  Rt _125 increase indep in applying wheel locks   _126 Side guards _127 prevent clothing getting caught in wheel or becoming soiled _128  prevent  skin tears/abrasions  Battery: yes _0 to power wheelchair ?????  Other: ????? ????? ?????  The above equipment has a life- long use expectancy. Growth and changes in medical and/or functional conditions would be the exceptions. This is to certify that the therapist has no financial relationship with durable  medical provider or manufacturer. The therapist will not receive remuneration of any kind for the equipment recommended in this evaluation.   Patient has mobility limitation that significantly impairs safe, timely participation in one or more mobility related ADL's.  (bathing, toileting, feeding, dressing, grooming, moving from room to room)                                                             _1  Yes _2  No Will mobility device sufficiently improve ability to participate and/or be aided in participation of MRADL's?         _3  Yes _4  No Can limitation be compensated for with use of a cane or walker?                                                                                _5  Yes _6  No Does patient or caregiver demonstrate ability/potential ability & willingness to safely use the mobility device?   _7  Yes _8  No Does patient's home environment support use of recommended mobility device?                                                    _9  Yes _10  No Does patient have sufficient upper extremity function necessary to functionally propel a manual wheelchair?    _11  Yes _12  No Does patient have sufficient strength and trunk stability to safely operate a POV (scooter)?                                  _13  Yes _14  No Does patient need additional features/benefits provided by a power wheelchair for MRADL's in the home?       _15  Yes _16  No Does the patient demonstrate the ability to safely use a power wheelchair?                                                              _17  Yes _18  No  Therapist Name Printed: Alease Medina PT, DPT Date: 04/25/15  Therapist's Signature:   Date:   Supplier's Name Printed: ????? Date: ?????  Supplier's Signature:   Date:  Patient/Caregiver Signature:   Date:     This is to certify that I have read this evaluation and do agree with  the content within:    Physician's Name Printed: ?????  Physician's Signature:  Date:     This is to certify that I, the  above signed therapist have the following affiliations: _0  This DME provider _1  Manufacturer of recommended equipment _2  Patient's long term care facility _3  None of the above                           PT Education - 04/28/15 1609    Education provided Yes   Education Details recommended outpatient PT also for improved safety and ndependence in transfers   Person(s) Educated Patient;Child(ren)   Methods Explanation   Comprehension Verbalized understanding             PT Long Term Goals - April 28, 2015 1614    PT LONG TERM GOAL #1   Title pt will verbalize understanding of power wheel chair safety and recommendations.    Time 2   Period Weeks   Status New               Plan - 04-28-15 1611    Clinical Impression Statement pt presents today for a mobility assessement. pt demonstrates impaired strength, especially on the R side as well as R hamstring contracture limiting her ability / mobility and ability to participate safely in ADLs, home mobility, and transfers. pt also has impaired sitting balance, and is having some redness on her sacrum likely from pressure in sitting. pt does not have adequate arm strength for sustained pressure relief. pt had used a power wheelchair in the past which was not hers, and was able to sucessfully use this safely.     Pt will benefit from skilled therapeutic intervention in order to improve on the following deficits Decreased balance;Decreased mobility  transfers   Rehab Potential Good   PT Frequency 1x / week   PT Duration 2 weeks   PT Treatment/Interventions Balance training  transfer training          G-Codes - 04/28/2015 1615    Functional Assessment Tool Used MMT, ROM, clinical judgement    Functional Limitation Mobility: Walking and moving around   Mobility: Walking and Moving Around Current Status [phone]) At least 60 percent but less than 80 percent impaired, limited or restricted   Mobility: Walking and  Moving Around Goal Status [phone]) At least 60 percent but less than 80 percent impaired, limited or restricted   Mobility: Walking and Moving Around Discharge Status [phone]) At least 60 percent but less than 80 percent impaired, limited or restricted       Problem List There are no active problems to display for this patient.  Gorden Harms. Alisabeth Selkirk, PT, DPT [phone]   Elenna Spratling April 28, 2015, 4:16 PM  [facility] [place] [facility] MAIN [facility] SERVICES [address] [place], [place], [address] Phone: [phone]   Fax:  [phone]

## 2015-05-02 DIAGNOSIS — R29898 Other symptoms and signs involving the musculoskeletal system: Secondary | ICD-10-CM | POA: Insufficient documentation

## 2015-05-02 HISTORY — DX: Other symptoms and signs involving the musculoskeletal system: R29.898

## 2015-05-23 ENCOUNTER — Ambulatory Visit: Payer: Medicare Other

## 2015-06-05 ENCOUNTER — Ambulatory Visit: Payer: Medicare Other

## 2015-06-27 ENCOUNTER — Ambulatory Visit: Payer: Medicare Other | Admitting: Physical Therapy

## 2015-08-13 DIAGNOSIS — L89159 Pressure ulcer of sacral region, unspecified stage: Secondary | ICD-10-CM | POA: Insufficient documentation

## 2015-08-13 DIAGNOSIS — S82839A Other fracture of upper and lower end of unspecified fibula, initial encounter for closed fracture: Secondary | ICD-10-CM | POA: Insufficient documentation

## 2015-08-13 DIAGNOSIS — S82309A Unspecified fracture of lower end of unspecified tibia, initial encounter for closed fracture: Secondary | ICD-10-CM

## 2015-08-13 HISTORY — DX: Pressure ulcer of sacral region, unspecified stage: L89.159

## 2015-08-13 HISTORY — DX: Other fracture of upper and lower end of unspecified fibula, initial encounter for closed fracture: S82.309A

## 2015-08-19 DIAGNOSIS — I2510 Atherosclerotic heart disease of native coronary artery without angina pectoris: Secondary | ICD-10-CM

## 2015-08-19 DIAGNOSIS — I350 Nonrheumatic aortic (valve) stenosis: Secondary | ICD-10-CM

## 2015-08-19 HISTORY — DX: Atherosclerotic heart disease of native coronary artery without angina pectoris: I25.10

## 2015-08-19 HISTORY — DX: Nonrheumatic aortic (valve) stenosis: I35.0

## 2015-08-20 DIAGNOSIS — N3946 Mixed incontinence: Secondary | ICD-10-CM | POA: Insufficient documentation

## 2015-08-20 HISTORY — DX: Mixed incontinence: N39.46

## 2015-10-15 ENCOUNTER — Encounter: Payer: Self-pay | Admitting: Urology

## 2015-10-15 ENCOUNTER — Ambulatory Visit (INDEPENDENT_AMBULATORY_CARE_PROVIDER_SITE_OTHER): Payer: Medicare Other | Admitting: Urology

## 2015-10-15 VITALS — BP 177/65 | HR 82 | Ht 65.0 in | Wt 160.0 lb

## 2015-10-15 DIAGNOSIS — R32 Unspecified urinary incontinence: Secondary | ICD-10-CM

## 2015-10-15 DIAGNOSIS — N319 Neuromuscular dysfunction of bladder, unspecified: Secondary | ICD-10-CM | POA: Diagnosis not present

## 2015-10-15 DIAGNOSIS — N3946 Mixed incontinence: Secondary | ICD-10-CM

## 2015-10-15 LAB — BLADDER SCAN AMB NON-IMAGING

## 2015-10-15 LAB — URINALYSIS, COMPLETE
Bilirubin, UA: NEGATIVE
GLUCOSE, UA: NEGATIVE
KETONES UA: NEGATIVE
Nitrite, UA: POSITIVE — AB
PROTEIN UA: NEGATIVE
RBC UA: NEGATIVE
Specific Gravity, UA: 1.015 (ref 1.005–1.030)
Urobilinogen, Ur: 0.2 mg/dL (ref 0.2–1.0)
pH, UA: 6.5 (ref 5.0–7.5)

## 2015-10-15 LAB — MICROSCOPIC EXAMINATION
Epithelial Cells (non renal): >10 /hpf — ABNORMAL HIGH (ref 0–10)
RBC MICROSCOPIC, UA: NONE SEEN /HPF (ref 0–?)

## 2015-10-15 NOTE — Progress Notes (Signed)
10/15/2015 11:47 AM   Hannah Hines February 23, 1946 [id number]  Referring provider: No referring provider defined for this encounter.  Chief Complaint  Patient presents with  . Urinary Incontinence    New Patient    HPI: The patient is 69 year old woman is multiple cirrhosis for approximately 20 years and she may have had a stroke. She fractured her right foot recently and her cast comes off next week. Having said that she still uses a wheelchair. He soaks 4 pads per day. She was coughing sneezing as well as urgency. She is enuresis. She says sometimes she leaks mainly into a pad and other times were voided with a good flow  She denies history kidney stones and previous to surgery. She's not had a hysterectomy. She does not have a daily bowel movement  Modifying factors: There are no other modifying factors  Associated signs and symptoms: There are no other associated signs and symptoms Aggravating and relieving factors: There are no other aggravating or relieving factors Severity: Moderate Duration: Persistent     PMH: Past Medical History  Diagnosis Date  . Multiple sclerosis (HCC) 1991  . Hypertension   . Stroke [facility]) 2015    questionable  . Aortic heart valve narrowing 08/19/2015  . CAD in native artery 08/19/2015  . Benign essential HTN 04/10/2014  . Leg weakness 07/11/2014  . Closed fracture of distal end of fibula with tibia 08/13/2015  . Decubitus ulcer of sacral area 08/13/2015  . 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 OP (osteoporosis) 04/10/2014  . Mixed incontinence 08/20/2015  . Muscle rigidity 05/02/2015  . 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 Pure hypercholesterolemia 04/10/2014  . Avitaminosis D 04/10/2014    Surgical History: Past Surgical History  Procedure Laterality Date  . Coronary stent placement N/A     Home Medications:    Medication List       This list is accurate as of: 10/15/15 11:47 AM.  Always use your most recent med list.                 amLODipine 5 MG tablet  Commonly known as:  NORVASC     aspirin 81 MG tablet  Take 81 mg by mouth daily.     Calcium Carb-Ergocalciferol 500-200 MG-UNIT Tabs  Take by mouth.     HYDROcodone-acetaminophen 5-325 MG tablet  Commonly known as:  NORCO/VICODIN     metoprolol succinate 25 MG 24 hr tablet  Commonly known as:  TOPROL-XL  Take 25 mg by mouth.     Omega-3 1000 MG Caps  Take by mouth.     pravastatin 40 MG tablet  Commonly known as:  PRAVACHOL        Allergies: No Known Allergies  Family History: Family History  Problem Relation Age of Onset  . Bladder Cancer Neg Hx   . Prostate cancer Neg Hx   . Heart attack Father   . Diabetes Father     Social History:  reports that she quit smoking about 20 years ago. Her smoking use included Cigarettes. She smoked 1.50 packs per day. She does not have any smokeless tobacco history on file. Her alcohol and drug histories are not on file.  ROS: UROLOGY Frequent Urination?: Yes Hard to postpone urination?: Yes Burning/pain with urination?: No Get up at night to urinate?: No Leakage of urine?: Yes Urine stream starts and stops?: Yes Trouble starting stream?: No Do you have to strain to urinate?: No Blood in urine?: No Urinary  tract infection?: No Sexually transmitted disease?: No Injury to kidneys or bladder?: No Painful intercourse?: No Weak stream?: No Currently pregnant?: No Vaginal bleeding?: No Last menstrual period?: n  Gastrointestinal Nausea?: No Vomiting?: No Indigestion/heartburn?: No Diarrhea?: No Constipation?: Yes  Constitutional Fever: No Night sweats?: No Weight loss?: No Fatigue?: Yes  Skin Skin rash/lesions?: No Itching?: No  Eyes Blurred vision?: No Double vision?: No  Ears/Nose/Throat Sore throat?: No Sinus problems?: No  Hematologic/Lymphatic Swollen glands?: No Easy bruising?: Yes  Cardiovascular Leg swelling?: Yes Chest pain?: No  Respiratory Cough?:  No Shortness of breath?: No  Endocrine Excessive thirst?: No  Musculoskeletal Back pain?: No Joint pain?: No  Neurological Headaches?: No Dizziness?: No  Psychologic Depression?: No Anxiety?: No  Physical Exam: BP 177/65 mmHg  Pulse 82  Ht  (1.651 m)  Wt 160 lb (72.576 kg)  BMI 26.63 kg/m2  Constitutional:  Alert and oriented, No acute distress. HEENT:  AT, moist mucus membranes.  Trachea midline, no masses. Cardiovascular: No clubbing, cyanosis, or edema. Respiratory: Normal respiratory effort, no increased work of breathing. GI: Abdomen is soft, nontender, nondistended, no abdominal masses GU: No CVA tenderness. No abdominal tenderness Skin: No rashes, bruises or suspicious lesions. Lymph: No cervical or inguinal adenopathy. Neurologic: Grossly intact, no focal deficits, moving all 4 extremities. Psychiatric: Normal mood and affect.  Laboratory Data:  Urinalysis No results found for: COLORURINE, APPEARANCEUR, LABSPEC, PHURINE, GLUCOSEU, HGBUR, BILIRUBINUR, KETONESUR, PROTEINUR, UROBILINOGEN, NITRITE, LEUKOCYTESUR  Pertinent Imaging: Postvoid residual 100 mL  Assessment & Plan:  The patient is a 69 year old woman currently in a wheelchair with a fractured right foot who likely has a neurogenic bladder with mixed stress and urge incontinence and enuresis. Her urine with strong smelling and sent for culture. She has failed oxybutynin given at rehabilitation. I sent the patient's urine for culture. A like her to come back in approximately 2 weeks with her cast is off and do a pelvic examination and cystoscopy.   1. Urinary incontinence, mixed stress and urge incontinence 2. Possible urinary tract infection 3. Enuresis 4. Neurogenic bladder     Martina Sinner, MD  [facility] [address], [address] [place], [place] [address] [phone]

## 2015-10-17 ENCOUNTER — Telehealth: Payer: Self-pay | Admitting: Urology

## 2015-10-17 LAB — CULTURE, URINE COMPREHENSIVE

## 2015-10-17 NOTE — Telephone Encounter (Signed)
Called by answering service to treat UTI, currently symptomatic.  Bactrim DS called to pharmacy.  Vanna Scotland, MD

## 2015-10-29 ENCOUNTER — Telehealth: Payer: Self-pay | Admitting: Urology

## 2015-10-29 NOTE — Telephone Encounter (Signed)
Pt was seen in office on 11/21, started on an antibiotic on 11/23.  Gave urine sample on 11/21 and never heard back from the results of that sample and also did not receive a call from the nurse or doctor stating when they wanted to see Ms. Gapinski back.  Please advise.

## 2015-10-30 NOTE — Telephone Encounter (Signed)
LMOM for patient to call office back. 

## 2015-10-31 NOTE — Telephone Encounter (Signed)
LMOM

## 2015-11-01 NOTE — Telephone Encounter (Signed)
LMOM

## 2015-11-01 NOTE — Telephone Encounter (Signed)
Spoke with pt daughter in reference ucx. Daughter stated pt has completed abx and needs to be seen again. Daughter was transferred to the front to make f/u appt.

## 2015-11-05 ENCOUNTER — Telehealth: Payer: Self-pay | Admitting: Obstetrics and Gynecology

## 2015-11-05 ENCOUNTER — Encounter: Payer: Self-pay | Admitting: Obstetrics and Gynecology

## 2015-11-05 ENCOUNTER — Ambulatory Visit (INDEPENDENT_AMBULATORY_CARE_PROVIDER_SITE_OTHER): Payer: Medicare Other | Admitting: Obstetrics and Gynecology

## 2015-11-05 VITALS — BP 145/82 | HR 82 | Resp 16 | Ht 65.0 in

## 2015-11-05 DIAGNOSIS — N390 Urinary tract infection, site not specified: Secondary | ICD-10-CM | POA: Diagnosis not present

## 2015-11-05 DIAGNOSIS — N3946 Mixed incontinence: Secondary | ICD-10-CM

## 2015-11-05 LAB — URINALYSIS, COMPLETE
BILIRUBIN UA: NEGATIVE
GLUCOSE, UA: NEGATIVE
KETONES UA: NEGATIVE
NITRITE UA: NEGATIVE
PROTEIN UA: NEGATIVE
Specific Gravity, UA: 1.02 (ref 1.005–1.030)
UUROB: 0.2 mg/dL (ref 0.2–1.0)
pH, UA: 6 (ref 5.0–7.5)

## 2015-11-05 LAB — MICROSCOPIC EXAMINATION
Epithelial Cells (non renal): >10 /hpf — ABNORMAL HIGH (ref 0–10)
RBC MICROSCOPIC, UA: NONE SEEN /HPF (ref 0–?)

## 2015-11-05 NOTE — Telephone Encounter (Signed)
Hannah Hines wanted pt to have pelvic exam & cysto with Dr. Sherron Monday.  Offered 12/07/15 appt to pt and daughter, but daughter wanted to call [facility] to schedule appt for a possible Monday.  I told daughter that if [facility] could not work out appt date/time to call us back and we would schedule it here in our office.

## 2015-11-05 NOTE — Progress Notes (Signed)
11/05/2015 11:13 AM   Hannah Hines [birth year] [id number]  Referring provider: Marina Goodell, MD [address] [facility] - PRIMARY CARE [place], [place] [address]  Chief Complaint  Patient presents with  . Urinary Tract Infection  . Urinary Incontinence    HPI: Patient is a 69 year old female with history of multiple sclerosis presenting today to provide a follow-up urine specimen after treatment of previously diagnosed urinary tract infection at her last visit. She was seen by Dr. Omelia Blackwater on 10/15/15 with complaints of urinary incontinence.   Previous History: The patient is 69 year old woman is multiple sclerosis for approximately 20 years and she may have had a stroke. She fractured her right foot recently and her cast comes off next week. Having said that she still uses a wheelchair. He soaks 4 pads per day. She was coughing sneezing as well as urgency. She is enuresis. She says sometimes she leaks mainly into a pad and other times were voided with a good flow  She denies history kidney stones and previous to surgery. She's not had a hysterectomy. She does not have a daily bowel movement  Modifying factors: There are no other modifying factors  Associated signs and symptoms: There are no other associated signs and symptoms Aggravating and relieving factors: There are no other aggravating or relieving factors Severity: Moderate Duration: Persistent    PMH: Past Medical History  Diagnosis Date  . Multiple sclerosis (HCC) 1991  . Hypertension   . Stroke [facility]) 2015    questionable  . Aortic heart valve narrowing 08/19/2015  . CAD in native artery 08/19/2015  . Benign essential HTN 04/10/2014  . Leg weakness 07/11/2014  . Closed fracture of distal end of fibula with tibia 08/13/2015  . Decubitus ulcer of sacral area 08/13/2015  . 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 OP (osteoporosis) 04/10/2014  . Mixed incontinence 08/20/2015  . Muscle rigidity 05/02/2015  .  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 Pure hypercholesterolemia 04/10/2014  . Avitaminosis D 04/10/2014    Surgical History: Past Surgical History  Procedure Laterality Date  . Coronary stent placement N/A     Home Medications:    Medication List       This list is accurate as of: 11/05/15 11:13 AM.  Always use your most recent med list.               aspirin 81 MG tablet  Take 81 mg by mouth daily.     Calcium Carb-Ergocalciferol 500-200 MG-UNIT Tabs  Take by mouth.     HYDROcodone-acetaminophen 5-325 MG tablet  Commonly known as:  NORCO/VICODIN     metoprolol succinate 25 MG 24 hr tablet  Commonly known as:  TOPROL-XL  Take 25 mg by mouth.     Omega-3 1000 MG Caps  Take by mouth.     pravastatin 40 MG tablet  Commonly known as:  PRAVACHOL     silver sulfADIAZINE 1 % cream  Commonly known as:  SILVADENE  Apply topically.        Allergies: No Known Allergies  Family History: Family History  Problem Relation Age of Onset  . Bladder Cancer Neg Hx   . Prostate cancer Neg Hx   . Heart attack Father   . Diabetes Father     Social History:  reports that she quit smoking about 20 years ago. Her smoking use included Cigarettes. She smoked 1.50 packs per day. She does not have any smokeless tobacco history on file.  Her alcohol and drug histories are not on file.  ROS: UROLOGY Frequent Urination?: Yes Hard to postpone urination?: Yes Burning/pain with urination?: No Get up at night to urinate?: No Leakage of urine?: Yes Urine stream starts and stops?: Yes Trouble starting stream?: Yes Do you have to strain to urinate?: No Blood in urine?: No Urinary tract infection?: No Sexually transmitted disease?: No Injury to kidneys or bladder?: No Painful intercourse?: No Weak stream?: No Currently pregnant?: No Vaginal bleeding?: No Last menstrual period?: n  Gastrointestinal Nausea?: No Vomiting?: No Indigestion/heartburn?: No Diarrhea?:  No Constipation?: Yes  Constitutional Fever: No Night sweats?: No Weight loss?: No Fatigue?: Yes  Skin Skin rash/lesions?: No Itching?: No  Eyes Blurred vision?: No Double vision?: No  Ears/Nose/Throat Sore throat?: No Sinus problems?: No  Hematologic/Lymphatic Swollen glands?: No Easy bruising?: No  Cardiovascular Leg swelling?: Yes Chest pain?: No  Respiratory Cough?: No Shortness of breath?: No  Endocrine Excessive thirst?: No  Musculoskeletal Back pain?: Yes Joint pain?: Yes  Neurological Headaches?: No Dizziness?: No  Psychologic Depression?: No Anxiety?: No  Physical Exam: BP 145/82 mmHg  Pulse 82  Resp 16  Ht  (1.651 m)  Wt   Constitutional:  Alert and oriented, No acute distress. HEENT:  AT, moist mucus membranes.  Trachea midline, no masses. Cardiovascular: No clubbing, cyanosis, or edema. Respiratory: Normal respiratory effort, no increased work of breathing. GU: No CVA tenderness. Skin: No rashes, bruises or suspicious lesions. Neurologic: Grossly intact, no focal deficits, moving upper extremities. Psychiatric: Normal mood and affect.  Laboratory Data:   Urinalysis    Component Value Date/Time   GLUCOSEU Negative 10/15/2015 1128   BILIRUBINUR Negative 10/15/2015 1128   NITRITE Positive* 10/15/2015 1128   LEUKOCYTESUR 1+* 10/15/2015 1128    Pertinent Imaging:   Assessment & Plan:   1. UTI (lower urinary tract infection)- Repeat urine specimen obtained today and will be sent for culture. Patient denies any change in urinary symptoms. - Urinalysis, Complete  2. Urinary incontinence- Per Dr. MaDiarmid last note patient was to be scheduled for an appointment for cystoscopy and pelvic exam. This appointment however has not yet been scheduled. The patient states that she can only come on Mondays due to transportation issues. Patient will be scheduled for follow-up with Dr. Caryl Pina either here or at [facility] urology in  [place].   Return for cystoscopy and pelvic exam with Dr. Sherron Monday.  These notes generated with voice recognition software. I apologize for typographical errors.  Earlie Lou, FNP  [facility] [address], [address] [place], [place] [address] [phone]

## 2015-11-07 ENCOUNTER — Telehealth: Payer: Self-pay

## 2015-11-07 LAB — CULTURE, URINE COMPREHENSIVE

## 2015-11-07 NOTE — Telephone Encounter (Signed)
-----   Message from Fernanda Drum, FNP sent at 11/07/2015  1:46 PM EST ----- Notify patient that her urine culture was negative for infection. Thanks

## 2015-11-07 NOTE — Telephone Encounter (Signed)
LMOM- recent labs negative.  

## 2015-12-07 ENCOUNTER — Ambulatory Visit (INDEPENDENT_AMBULATORY_CARE_PROVIDER_SITE_OTHER): Payer: Medicare Other | Admitting: Urology

## 2015-12-07 ENCOUNTER — Other Ambulatory Visit: Payer: Medicare Other

## 2015-12-07 VITALS — BP 173/79 | Ht 65.0 in

## 2015-12-07 DIAGNOSIS — N3946 Mixed incontinence: Secondary | ICD-10-CM

## 2015-12-07 DIAGNOSIS — R350 Frequency of micturition: Secondary | ICD-10-CM

## 2015-12-07 LAB — URINALYSIS, COMPLETE
Bilirubin, UA: NEGATIVE
GLUCOSE, UA: NEGATIVE
Ketones, UA: NEGATIVE
Nitrite, UA: POSITIVE — AB
PROTEIN UA: NEGATIVE
RBC UA: NEGATIVE
SPEC GRAV UA: 1.01 (ref 1.005–1.030)
UUROB: 0.2 mg/dL (ref 0.2–1.0)
pH, UA: 6.5 (ref 5.0–7.5)

## 2015-12-07 LAB — MICROSCOPIC EXAMINATION: RBC MICROSCOPIC, UA: NONE SEEN /HPF (ref 0–?)

## 2015-12-07 MED ORDER — TRIMETHOPRIM 100 MG PO TABS: 100.0000 mg | ORAL_TABLET | Freq: Every day | ORAL | Status: DC

## 2015-12-07 NOTE — Progress Notes (Signed)
12/07/2015 2:46 PM   Hannah Hines May 26, 1946 [id number]  Referring provider: Marina Goodell, MD [address] [facility] - PRIMARY CARE [place], [place] [address]  Chief Complaint  Patient presents with  . Cysto    HPI: The patient is 70 year old woman is multiple cirrhosis for approximately 20 years and she may have had a stroke. She fractured her right foot recently and her cast comes off next week. Having said that she still uses a wheelchair. He soaks 4 pads per day. She was coughing sneezing as well as urgency. She is enuresis. 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 She had just fractured her foot. She failed oxybutynin.   The urine culture was positive  She subsequently returned and had a negative culture. She's here for pelvic examination and cystoscopy  The patient has foul-smelling urine today  She underwent cystoscopy. Verbal consent given. Sterile technique utilized. She had grade 3-4 bladder trabeculation. There is no acute cystitis or erythema. There was a lot of white flecks in her urine. There is no foreign body or carcinoma    PMH: Past Medical History  Diagnosis Date  . Multiple sclerosis (HCC) 1991  . Hypertension   . Stroke [facility]) 2015    questionable  . Aortic heart valve narrowing 08/19/2015  . CAD in native artery 08/19/2015  . Benign essential HTN 04/10/2014  . Leg weakness 07/11/2014  . Closed fracture of distal end of fibula with tibia 08/13/2015  . Decubitus ulcer of sacral area 08/13/2015  . 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 OP (osteoporosis) 04/10/2014  . Mixed incontinence 08/20/2015  . Muscle rigidity 05/02/2015  . 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 Pure hypercholesterolemia 04/10/2014  . Avitaminosis D 04/10/2014    Surgical  History: Past Surgical History  Procedure Laterality Date  . Coronary stent placement N/A     Home Medications:    Medication List       This list is accurate as of: 12/07/15  2:46 PM.  Always use your most recent med list.               aspirin 81 MG tablet  Take 81 mg by mouth daily.     HYDROcodone-acetaminophen 5-325 MG tablet  Commonly known as:  NORCO/VICODIN  Reported on 12/07/2015     metoprolol succinate 25 MG 24 hr tablet  Commonly known as:  TOPROL-XL  Take 25 mg by mouth.     Omega-3 1000 MG Caps  Take by mouth.     pravastatin 40 MG tablet  Commonly known as:  PRAVACHOL     silver sulfADIAZINE 1 % cream  Commonly known as:  SILVADENE  Apply topically.        Allergies: No Known Allergies  Family History: Family History  Problem Relation Age of Onset  . Bladder Cancer Neg Hx   . Prostate cancer Neg Hx   . Heart attack Father   . Diabetes Father     Social History:  reports that she quit smoking about 20 years ago. Her smoking use included Cigarettes. She smoked 1.50 packs per day. She does not have any smokeless tobacco history on file. Her alcohol and drug histories are not on file.  ROS: UROLOGY Frequent Urination?: Yes Hard to postpone urination?: Yes Burning/pain  with urination?: No Get up at night to urinate?: No Leakage of urine?: Yes Urine stream starts and stops?: No Trouble starting stream?: No Do you have to strain to urinate?: No Blood in urine?: No Urinary tract infection?: No Sexually transmitted disease?: No Injury to kidneys or bladder?: No Painful intercourse?: No Weak stream?: No Currently pregnant?: No Vaginal bleeding?: No Last menstrual period?: No  Gastrointestinal Nausea?: No Vomiting?: No Indigestion/heartburn?: No Diarrhea?: No Constipation?: Yes  Constitutional Fever: No Night sweats?: No Weight loss?: No Fatigue?: No  Skin Skin rash/lesions?: No Itching?: No  Eyes Blurred vision?:  No Double vision?: No  Ears/Nose/Throat Sore throat?: No Sinus problems?: No  Hematologic/Lymphatic Swollen glands?: No Easy bruising?: Yes  Cardiovascular Leg swelling?: Yes Chest pain?: No  Respiratory Cough?: No Shortness of breath?: No  Endocrine Excessive thirst?: No  Musculoskeletal Back pain?: No Joint pain?: No  Neurological Headaches?: No Dizziness?: No  Psychologic Depression?: No Anxiety?: No  Physical Exam: BP 173/79 mmHg  Ht  (1.651 m)  Constitutional:  Alert and oriented, No acute distress. HEENT: [place] AT, moist mucus membranes.  Trachea midline, no masses. Cardiovascular: No clubbing, cyanosis, or edema. Respiratory: Normal respiratory effort, no increased work of breathing. GI: Abdomen is soft, nontender, nondistended, no abdominal masses GU: No CVA tenderness. Reasonably well supported bladder neck and no stress incontinence Skin: No rashes, bruises or suspicious lesions. Lymph: No cervical or inguinal adenopathy. Neurologic: Grossly intact, no focal deficits, moving all 4 extremities. Psychiatric: Normal mood and affect.  Laboratory Data: No results found for: WBC, HGB, HCT, MCV, PLT  No results found for: CREATININE  No results found for: PSA  No results found for: TESTOSTERONE  No results found for: HGBA1C  Urinalysis    Component Value Date/Time   GLUCOSEU Negative 11/05/2015 1031   BILIRUBINUR Negative 11/05/2015 1031   NITRITE Negative 11/05/2015 1031   LEUKOCYTESUR 3+* 11/05/2015 1031    Pertinent Imaging: none  Assessment & Plan:  The patient has a neurogenic bladder. She has chronic cystitis. We will need to have reasonable treatment goals. I will call if the culture report is positive. I would like to start by first trying to control her urinary tract infections. See on urinary prophylaxis in 6 weeks. Then start behavioral medical therapy.  1. Mixed incontinence 2. Chronic cystitis 3. Neurogenic  bladder  Martina Sinner, MD  [facility] [address], [address] [place], [place] [address] [phone]

## 2015-12-07 NOTE — Addendum Note (Signed)
Addended by: Lonna Cobb on: 12/07/2015 03:03 PM   Modules accepted: Orders

## 2015-12-10 ENCOUNTER — Telehealth: Payer: Self-pay

## 2015-12-10 DIAGNOSIS — N390 Urinary tract infection, site not specified: Secondary | ICD-10-CM

## 2015-12-10 LAB — CULTURE, URINE COMPREHENSIVE

## 2015-12-10 MED ORDER — TRIMETHOPRIM 100 MG PO TABS
100.0000 mg | ORAL_TABLET | Freq: Every day | ORAL | Status: DC
Start: 2015-12-10 — End: 2016-02-13

## 2015-12-10 MED ORDER — SULFAMETHOXAZOLE-TRIMETHOPRIM 800-160 MG PO TABS: 1.0000 | ORAL_TABLET | Freq: Two times a day (BID) | ORAL | Status: DC

## 2015-12-10 NOTE — Telephone Encounter (Signed)
I received a call from Dr. Sherron Monday about calling in a prescription for Bactrim DS because of a positive culture. Prescription sent to [facility]. Pt notified.

## 2015-12-24 ENCOUNTER — Other Ambulatory Visit: Payer: Medicare Other

## 2016-02-01 ENCOUNTER — Encounter: Payer: Self-pay | Admitting: Urology

## 2016-02-01 ENCOUNTER — Ambulatory Visit (INDEPENDENT_AMBULATORY_CARE_PROVIDER_SITE_OTHER): Payer: Medicare Other | Admitting: Urology

## 2016-02-01 VITALS — BP 124/71 | HR 77 | Ht 65.0 in

## 2016-02-01 DIAGNOSIS — N390 Urinary tract infection, site not specified: Secondary | ICD-10-CM

## 2016-02-01 DIAGNOSIS — N3946 Mixed incontinence: Secondary | ICD-10-CM | POA: Diagnosis not present

## 2016-02-01 DIAGNOSIS — R350 Frequency of micturition: Secondary | ICD-10-CM | POA: Diagnosis not present

## 2016-02-01 LAB — URINALYSIS, COMPLETE
Bilirubin, UA: NEGATIVE
GLUCOSE, UA: NEGATIVE
Ketones, UA: NEGATIVE
NITRITE UA: POSITIVE — AB
PH UA: 6 (ref 5.0–7.5)
Protein, UA: NEGATIVE
Specific Gravity, UA: 1.01 (ref 1.005–1.030)
UUROB: 0.2 mg/dL (ref 0.2–1.0)

## 2016-02-01 LAB — MICROSCOPIC EXAMINATION

## 2016-02-01 MED ORDER — SOLIFENACIN SUCCINATE 5 MG PO TABS: 5.0000 mg | ORAL_TABLET | Freq: Every day | ORAL | Status: AC

## 2016-02-01 NOTE — Progress Notes (Signed)
02/01/2016 9:49 AM   Hannah Hines 1946/10/14 [id number]  Referring provider: Marina Goodell, MD [address] [facility] - PRIMARY CARE [place], [place] [address]  Chief Complaint  Patient presents with  . Follow-up    incontinence, chronic cystitis    HPI: The patient is 70 year old woman is multiple sclerosis for approximately 20 years and she may have had a stroke. She fractured her right foot recently and her cast comes off next week. Having said that she still uses a wheelchair. He soaks 4 pads per day. She was coughing sneezing as well as urgency. She is enuresis. 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 She had just fractured her foot. She failed oxybutynin.   She underwent cystoscopy. Verbal consent given. Sterile technique utilized. She had grade 3-4 bladder trabeculation. There is no acute cystitis or erythema. There was a lot of white flecks in her urine. There is no foreign body or carcinoma  The patient still has strong smelling urine. She still soaks 3 pads a day with urge incontinence and enuresis. She is slow to get to the restroom. She thinks the once a day antibiotic helped some. He is on trimethoprim.  Based on immobility we needed reasonable treatment goals but I want to control the patient's chronic cystitis first. I placed her on once daily antibiotic suppression therapy  Her culture was positive of the day I saw her    PMH: Past Medical History  Diagnosis Date  . Multiple sclerosis (HCC) 1991  . Hypertension   . Stroke [facility]) 2015    questionable  . Aortic heart valve narrowing 08/19/2015  . CAD in native artery 08/19/2015  . Benign essential HTN 04/10/2014  . Leg weakness 07/11/2014  . Closed fracture of distal end of fibula with tibia 08/13/2015  . Decubitus  ulcer of sacral area 08/13/2015  . 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 OP (osteoporosis) 04/10/2014  . Mixed incontinence 08/20/2015  . Muscle rigidity 05/02/2015  . 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 Pure hypercholesterolemia 04/10/2014  . Avitaminosis D 04/10/2014    Surgical History: Past Surgical History  Procedure Laterality Date  . Coronary stent placement N/A     Home Medications:    Medication List       This list is accurate as of: 02/01/16  9:49 AM.  Always use your most recent med list.               aspirin 81 MG tablet  Take 81 mg by mouth daily.     HYDROcodone-acetaminophen 5-325 MG tablet  Commonly known as:  NORCO/VICODIN  Reported on 02/01/2016     metoprolol succinate 25 MG 24 hr tablet  Commonly known as:  TOPROL-XL  Take 25 mg by mouth.     Omega-3 1000 MG Caps  Take by mouth.     pravastatin 40 MG tablet  Commonly known as:  PRAVACHOL     silver sulfADIAZINE 1 % cream  Commonly known as:  SILVADENE  Apply topically. Reported on 02/01/2016     sulfamethoxazole-trimethoprim 800-160 MG tablet  Commonly known as:  BACTRIM DS,SEPTRA DS  Take 1 tablet by mouth every 12 (twelve) hours.     trimethoprim 100 MG tablet  Commonly known as:  TRIMPEX  Take 1 tablet (  100 mg total) by mouth daily.        Allergies: No Known Allergies  Family History: Family History  Problem Relation Age of Onset  . Bladder Cancer Neg Hx   . Prostate cancer Neg Hx   . Heart attack Father   . Diabetes Father     Social History:  reports that she quit smoking about 20 years ago. Her smoking use included Cigarettes. She smoked 1.50 packs per day. She does not have any smokeless tobacco history on file. Her alcohol and drug histories are not on file.  ROS: UROLOGY Frequent Urination?: Yes Hard to postpone urination?: Yes Burning/pain with urination?: No Get up at night to urinate?: No Leakage of urine?: Yes Urine stream starts and stops?: No Trouble  starting stream?: No Do you have to strain to urinate?: No Blood in urine?: No Urinary tract infection?: No Sexually transmitted disease?: No Injury to kidneys or bladder?: No Painful intercourse?: No Weak stream?: No Currently pregnant?: No Vaginal bleeding?: No Last menstrual period?: n  Gastrointestinal Nausea?: No Vomiting?: No Indigestion/heartburn?: No Diarrhea?: No Constipation?: Yes  Constitutional Fever: No Night sweats?: No Weight loss?: No Fatigue?: No  Skin Skin rash/lesions?: No Itching?: No  Eyes Blurred vision?: No Double vision?: No  Ears/Nose/Throat Sore throat?: No Sinus problems?: No  Hematologic/Lymphatic Swollen glands?: No Easy bruising?: No  Cardiovascular Leg swelling?: No Chest pain?: No  Respiratory Cough?: No Shortness of breath?: No  Endocrine Excessive thirst?: No  Musculoskeletal Back pain?: No Joint pain?: No  Neurological Headaches?: No Dizziness?: No  Psychologic Depression?: No Anxiety?: No  Physical Exam: BP 124/71 mmHg  Pulse 77  Ht 5\' 5"  (1.651 m)  Wt     Laboratory Data:  Urinalysis    Component Value Date/Time   GLUCOSEU Negative 12/07/2015 1419   BILIRUBINUR Negative 12/07/2015 1419   NITRITE Positive* 12/07/2015 1419   LEUKOCYTESUR 1+* 12/07/2015 1419    Pertinent Imaging:   Assessment & Plan:  Ms. Debernardi may have a urinary tract infection. Stay on trimethoprim. If the urine culture is positive we will treat it and put her back on trimethoprim. Today I started her on Vesicare 5 mg samples and prescription. I will reevaluate her with timed voiding and fluid modifications in 6 weeks  1. Mixed incontinence 2. Chronic cystitis  - Urinalysis, Complete  2. Urinary frequency  - Urinalysis, Complete   No Follow-up on file.  Martina Sinner, MD  [facility] [address], [address] [place], [place] [address] [phone]

## 2016-02-03 LAB — CULTURE, URINE COMPREHENSIVE

## 2016-02-04 ENCOUNTER — Telehealth: Payer: Self-pay

## 2016-02-04 DIAGNOSIS — N390 Urinary tract infection, site not specified: Secondary | ICD-10-CM

## 2016-02-04 MED ORDER — AMOXICILLIN-POT CLAVULANATE 875-125 MG PO TABS: 1.0000 | ORAL_TABLET | Freq: Two times a day (BID) | ORAL | Status: AC

## 2016-02-04 NOTE — Telephone Encounter (Signed)
Spoke with pt daughter, Tressia Danas, in reference to +ucx. Made aware of medication changes. Daughter voiced understanding.

## 2016-02-04 NOTE — Telephone Encounter (Signed)
-----   Message from Harle Battiest, PA-C sent at 02/03/2016  8:42 PM EDT ----- Patient has a urinary tract infection.  She needs Augmentin 875/125, 1 tablet twice daily, # 14 and to hold the trimethoprim.  When she completes the Augmentin, she is to resume her trimethoprim.  She was also need a recheck of her urine to ensure the urinary infection has cleared.

## 2016-02-05 ENCOUNTER — Telehealth: Payer: Self-pay | Admitting: Family Medicine

## 2016-02-05 NOTE — Telephone Encounter (Signed)
Last message sent in error, please disregard.

## 2016-02-05 NOTE — Telephone Encounter (Signed)
Pt needs a prescription for UTI, [phone]  She saw Dr. Sherron Monday last Friday 3/10

## 2016-02-05 NOTE — Telephone Encounter (Signed)
I only see where pt was supposed to be treated for +ucx and then back on trimethoprim. Please advise.

## 2016-02-13 ENCOUNTER — Telehealth: Payer: Self-pay

## 2016-02-13 ENCOUNTER — Other Ambulatory Visit: Payer: Self-pay

## 2016-02-13 DIAGNOSIS — N390 Urinary tract infection, site not specified: Secondary | ICD-10-CM

## 2016-02-13 MED ORDER — TRIMETHOPRIM 100 MG PO TABS: 100.0000 mg | ORAL_TABLET | Freq: Every day | ORAL | Status: DC

## 2016-02-13 NOTE — Progress Notes (Signed)
Pt called stating she never received her abx from when she saw Dr. Sherron Monday. Made pt aware spoke with her daughter who voiced understanding of medication. Pt stated daughter never picked up medication.  Nurse called [facility] who stated they would abx to pt today. Spoke with pt and made aware Augmentin was called into [facility] and they will deliver today. Pt stated that she would not pay full price for medication. Reinforced with pt she may get very sick if she does not have the medication. Pt voiced understanding but again stated she would not pay full price. Gave pt [facility] phone number.  Nurse called trimethoprim into [facility] pharmacy for pt to start when Augmentin is completed. Pt voiced understanding.

## 2016-02-13 NOTE — Telephone Encounter (Signed)
LMOM

## 2016-02-15 NOTE — Telephone Encounter (Signed)
LMOM

## 2016-02-15 NOTE — Telephone Encounter (Signed)
Spoke with pt in reference to augmentin. Pt stated she did receive medication yesterday and has started it. Reinforced with pt to start trimethoprim when augmentin is completed. Pt voiced understanding.

## 2016-03-17 ENCOUNTER — Ambulatory Visit (INDEPENDENT_AMBULATORY_CARE_PROVIDER_SITE_OTHER): Payer: Medicare Other | Admitting: Urology

## 2016-03-17 ENCOUNTER — Encounter: Payer: Self-pay | Admitting: Urology

## 2016-03-17 VITALS — BP 162/80 | HR 60 | Temp 97.5°F | Resp 16

## 2016-03-17 DIAGNOSIS — R32 Unspecified urinary incontinence: Secondary | ICD-10-CM | POA: Diagnosis not present

## 2016-03-17 NOTE — Progress Notes (Signed)
03/17/2016 10:53 AM   Hannah Hines 11-07-1946 [id number]  Referring provider: Marina Goodell, MD [address] [facility] - PRIMARY CARE [place], [place] [address]  Chief Complaint  Patient presents with  . Follow-up    HPI: The patient is 70 year old woman is multiple sclerosis for approximately 20 years and she may have had a stroke. She fractured her right foot recently and her cast comes off next week. Having said that she still uses a wheelchair. He soaks 4 pads per day. She was coughing sneezing as well as urgency. She is enuresis. 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 She had just fractured her foot. She failed oxybutynin.     During the patient's last visit she had cystoscopy on trimethoprim.. Felt the treatment goals had to be reasonable. I gave her Vesicare and kept her on trimethoprim. The last culture was positive. She was treated with Augmentin  It was difficult to say which medication she was currently taking. She thinks she is 20% improved. I believe she has stopped her trimethoprim. She did not 1 leave another culture since she is always infected and she may be chronically colonized but her urine is very foul-smelling on past visits   PMH: Past Medical History  Diagnosis Date  . Multiple sclerosis (HCC) 1991  . Hypertension   . Stroke [facility]) 2015    questionable  . Aortic heart valve narrowing 08/19/2015  . CAD in native artery 08/19/2015  . Benign essential HTN 04/10/2014  . Leg weakness 07/11/2014  . Closed fracture of distal end of fibula with tibia 08/13/2015  . Decubitus ulcer of sacral area 08/13/2015  . 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 OP (osteoporosis) 04/10/2014  . Mixed incontinence 08/20/2015  . Muscle rigidity 05/02/2015  . Monoparesis of leg (HCC)  09/13/2014  . Pure hypercholesterolemia 04/10/2014  . Avitaminosis D 04/10/2014    Surgical History: Past Surgical History  Procedure Laterality Date  . Coronary stent placement N/A     Home Medications:    Medication List       This list is accurate as of: 03/17/16 10:53 AM.  Always use your most recent med list.               aspirin 81 MG tablet  Take 81 mg by mouth daily.     HYDROcodone-acetaminophen 5-325 MG tablet  Commonly known as:  NORCO/VICODIN  Reported on 02/01/2016     metoprolol succinate 25 MG 24 hr tablet  Commonly known as:  TOPROL-XL  Take 25 mg by mouth.     Omega-3 1000 MG Caps  Take by mouth.     pravastatin 40 MG tablet  Commonly known as:  PRAVACHOL     silver sulfADIAZINE 1 % cream  Commonly known as:  SILVADENE  Apply topically. Reported on 02/01/2016     solifenacin 5 MG tablet  Commonly known as:  VESICARE  Take 1 tablet (5 mg total) by mouth daily.     sulfamethoxazole-trimethoprim 800-160 MG tablet  Commonly known as:  BACTRIM DS,SEPTRA DS  Take 1 tablet by mouth every 12 (twelve) hours.     trimethoprim 100 MG tablet  Commonly known as:  TRIMPEX  Take 1 tablet (100 mg total) by mouth daily.  Allergies: No Known Allergies  Family History: Family History  Problem Relation Age of Onset  . Bladder Cancer Neg Hx   . Prostate cancer Neg Hx   . Heart attack Father   . Diabetes Father     Social History:  reports that she quit smoking about 20 years ago. Her smoking use included Cigarettes. She smoked 1.50 packs per day. She does not have any smokeless tobacco history on file. Her alcohol and drug histories are not on file.  ROS: UROLOGY Frequent Urination?: Yes Hard to postpone urination?: Yes Burning/pain with urination?: No Get up at night to urinate?: No Leakage of urine?: Yes Urine stream starts and stops?: Yes Trouble starting stream?: No Do you have to strain to urinate?: No Blood in urine?: No Urinary  tract infection?: Yes Sexually transmitted disease?: No Injury to kidneys or bladder?: No Painful intercourse?: No Weak stream?: No Currently pregnant?: No Vaginal bleeding?: No Last menstrual period?: n  Gastrointestinal Nausea?: No Vomiting?: No Indigestion/heartburn?: No Diarrhea?: No Constipation?: No  Constitutional Fever: No Night sweats?: No Weight loss?: No Fatigue?: No  Skin Skin rash/lesions?: No Itching?: No  Eyes Blurred vision?: No Double vision?: No  Ears/Nose/Throat Sore throat?: No Sinus problems?: No  Hematologic/Lymphatic Swollen glands?: No Easy bruising?: No  Cardiovascular Leg swelling?: No Chest pain?: No  Respiratory Cough?: No Shortness of breath?: No  Endocrine Excessive thirst?: No  Musculoskeletal Back pain?: No Joint pain?: No  Neurological Headaches?: No Dizziness?: No  Psychologic Depression?: Yes (y) Anxiety?: No  Physical Exam: BP 162/80 mmHg  Pulse 60  Temp(Src) 97.5 F (36.4 C)  Resp 16  Wt     Laboratory Data:  Urinalysis    Component Value Date/Time   APPEARANCEUR Cloudy* 02/01/2016 0909   GLUCOSEU Negative 02/01/2016 0909   BILIRUBINUR Negative 02/01/2016 0909   PROTEINUR Negative 02/01/2016 0909   NITRITE Positive* 02/01/2016 0909   LEUKOCYTESUR 3+* 02/01/2016 0909    Pertinent Imaging: None  Assessment & Plan:  The patient has a difficult problem. It would be very reasonable for her to stay on trimethoprim.  Urine is still foul-smelling but underwent a keep her off her prophylaxis. It likely on the automobile control of with multiple positive cultures. She is 20% better on Vesicare. Toviaz 8 mg samples given. Reassess in one month and discuss time voiding fluid modifications and percutaneous tibial nerve stimulation  There are no diagnoses linked to this encounter.  No Follow-up on file.  Martina Sinner, MD  [facility] [address] [place], [place] [address] [phone]

## 2016-04-18 ENCOUNTER — Encounter: Payer: Self-pay | Admitting: Urology

## 2016-04-18 ENCOUNTER — Ambulatory Visit: Payer: Medicare Other | Admitting: Urology

## 2016-04-18 VITALS — BP 148/82 | HR 69 | Ht 65.0 in

## 2016-04-18 DIAGNOSIS — N390 Urinary tract infection, site not specified: Secondary | ICD-10-CM

## 2016-04-18 MED ORDER — TRIMETHOPRIM 100 MG PO TABS: 100.0000 mg | ORAL_TABLET | Freq: Every day | ORAL | Status: AC

## 2016-04-18 NOTE — Progress Notes (Unsigned)
04/18/2016 4:06 PM   Jairo Ben 24-May-1946 [id number]  Referring provider: Marina Goodell, MD [address] [facility] - PRIMARY CARE [place], [place] [address]  Chief Complaint  Patient presents with  . Follow-up    urge incontinence    HPI: The patient is 70 year old woman is multiple sclerosis for approximately 20 years and she may have had a stroke. She fractured her right foot recently and her cast comes off next week. Having said that she still uses a wheelchair. He soaks 4 pads per day. She was coughing sneezing as well as urgency. She is enuresis. 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 She had just fractured her foot. She failed oxybutynin.   She underwent cystoscopy. Verbal consent given. Sterile technique utilized. She had grade 3-4 bladder trabeculation. There is no acute cystitis or erythema. There was a lot of white flecks in her urine. There is no foreign body or carcinoma  The patient still has strong smelling urine. She still soaks 3 pads a day with urge incontinence and enuresis. She is slow to get to the restroom. She thinks the once a day antibiotic helped some. He is on trimethoprim.  Based on immobility we needed reasonable treatment goals but I want to control the patient's chronic cystitis first. I placed her on once daily antibiotic suppression therapy  When the patient was here last time and time voiding and she was on trimethoprim. The last urine culture once again was positive.       PMH: Past Medical History  Diagnosis Date  . Multiple sclerosis (HCC) 1991  . Hypertension   . Stroke [facility]) 2015    questionable  . Aortic heart valve narrowing 08/19/2015  . CAD in native artery 08/19/2015  . Benign essential HTN 04/10/2014  . Leg weakness 07/11/2014  .  Closed fracture of distal end of fibula with tibia 08/13/2015  . Decubitus ulcer of sacral area 08/13/2015  . 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 OP (osteoporosis) 04/10/2014  . Mixed incontinence 08/20/2015  . Muscle rigidity 05/02/2015  . 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 Pure hypercholesterolemia 04/10/2014  . Avitaminosis D 04/10/2014    Surgical History: Past Surgical History  Procedure Laterality Date  . Coronary stent placement N/A     Home Medications:    Medication List       This list is accurate as of: 04/18/16  4:06 PM.  Always use your most recent med list.               aspirin 81 MG tablet  Take 81 mg by mouth daily.     HYDROcodone-acetaminophen 5-325 MG tablet  Commonly known as:  NORCO/VICODIN  Reported on 02/01/2016     metoprolol succinate 25 MG 24 hr tablet  Commonly known as:  TOPROL-XL  Take 25 mg by mouth.     metoprolol tartrate 25 MG tablet  Commonly known as:  LOPRESSOR  Take by mouth.     Omega-3 1000 MG Caps  Take by mouth.     pravastatin 40 MG tablet  Commonly known as:  PRAVACHOL     silver sulfADIAZINE 1 % cream  Commonly known as:  SILVADENE  Apply topically. Reported on 02/01/2016     solifenacin 5 MG tablet  Commonly  known as:  VESICARE  Take 1 tablet (5 mg total) by mouth daily.     sulfamethoxazole-trimethoprim 800-160 MG tablet  Commonly known as:  BACTRIM DS,SEPTRA DS  Take 1 tablet by mouth every 12 (twelve) hours.     trimethoprim 100 MG tablet  Commonly known as:  TRIMPEX  Take 1 tablet (100 mg total) by mouth daily.        Allergies: No Known Allergies  Family History: Family History  Problem Relation Age of Onset  . Bladder Cancer Neg Hx   . Prostate cancer Neg Hx   . Heart attack Father   . Diabetes Father     Social History:  reports that she quit smoking about 20 years ago. Her smoking use included Cigarettes. She smoked 1.50 packs per day. She does not have any smokeless tobacco history on  file. Her alcohol and drug histories are not on file.  ROS:                                        Physical Exam: There were no vitals taken for this visit.   Laboratory Data:  Urinalysis    Component Value Date/Time   APPEARANCEUR Cloudy* 02/01/2016 0909   GLUCOSEU Negative 02/01/2016 0909   BILIRUBINUR Negative 02/01/2016 0909   PROTEINUR Negative 02/01/2016 0909   NITRITE Positive* 02/01/2016 0909   LEUKOCYTESUR 3+* 02/01/2016 0909    Pertinent Imaging: none  Assessment & Plan:  The patient has difficult to control chronic cystitis. She has urgency incontinence with a functional component and a neurogenic bladder.  I don't think she is on her trimethoprim and there may been some confusion post taking Augmentin. On putting her back on it. She is a partial responder to Vesicare 5 mg so I'll see her in a month on 5 weeks of Vesicare 10 mg samples.  There are no diagnoses linked to this encounter.  No Follow-up on file.  Martina Sinner, MD  [facility] [address], [address] [place], [place] [address] [phone]

## 2016-05-12 ENCOUNTER — Ambulatory Visit: Payer: Medicare Other | Admitting: Urology

## 2016-05-12 ENCOUNTER — Ambulatory Visit: Payer: Medicare Other

## 2016-05-20 ENCOUNTER — Emergency Department
Admission: EM | Admit: 2016-05-20 | Discharge: 2016-05-24 | Disposition: E | Payer: Medicare Other | Attending: Emergency Medicine | Admitting: Emergency Medicine

## 2016-05-20 DIAGNOSIS — Z79899 Other long term (current) drug therapy: Secondary | ICD-10-CM | POA: Insufficient documentation

## 2016-05-20 DIAGNOSIS — I2510 Atherosclerotic heart disease of native coronary artery without angina pectoris: Secondary | ICD-10-CM | POA: Insufficient documentation

## 2016-05-20 DIAGNOSIS — Z87891 Personal history of nicotine dependence: Secondary | ICD-10-CM | POA: Insufficient documentation

## 2016-05-20 DIAGNOSIS — M810 Age-related osteoporosis without current pathological fracture: Secondary | ICD-10-CM | POA: Insufficient documentation

## 2016-05-20 DIAGNOSIS — I469 Cardiac arrest, cause unspecified: Secondary | ICD-10-CM | POA: Diagnosis not present

## 2016-05-20 DIAGNOSIS — Z7982 Long term (current) use of aspirin: Secondary | ICD-10-CM | POA: Diagnosis not present

## 2016-05-20 DIAGNOSIS — I10 Essential (primary) hypertension: Secondary | ICD-10-CM | POA: Diagnosis not present

## 2016-05-20 MED ORDER — SODIUM BICARBONATE 8.4 % IV SOLN
INTRAVENOUS | Status: AC | PRN
  Administered 2016-05-20: 100 meq via INTRAVENOUS

## 2016-05-20 MED ORDER — MAGNESIUM SULFATE 50 % IJ SOLN
INTRAMUSCULAR | Status: AC | PRN
  Administered 2016-05-20: 2 g via INTRAVENOUS

## 2016-05-20 MED ORDER — EPINEPHRINE HCL 0.1 MG/ML IJ SOSY
PREFILLED_SYRINGE | INTRAMUSCULAR | Status: AC | PRN
  Administered 2016-05-20: 1 mg via INTRAVENOUS

## 2016-05-20 MED ORDER — SODIUM CHLORIDE 0.9 % IV SOLN
INTRAVENOUS | Status: AC | PRN
  Administered 2016-05-20: 1000 mL via INTRAVENOUS

## 2016-05-20 MED FILL — Medication: Qty: 1 | Status: AC

## 2016-05-24 NOTE — Code Documentation (Addendum)
Family at beside. Family given emotional support. 

## 2016-05-24 NOTE — Progress Notes (Signed)
°   05-22-16 0100  Clinical Encounter Type  Visited With Patient and family together  Visit Type Death;ED  Referral From Nurse  Consult/Referral To Chaplain  Spiritual Encounters  Spiritual Needs Grief support  Stress Factors  Family Stress Factors Loss  Patient was admitted and deceased. Family was present at the time of death. Daughter requested to take hair sample and apprised staff that the patient would be cremated.

## 2016-05-24 NOTE — ED Notes (Signed)
[facility] contacted. 

## 2016-05-24 NOTE — ED Notes (Signed)
Pt bib EMS from home w/ CPR in progress.  Per EMS, pt had been weak all day and had exhibited stroke like s/s according to daughter.  Daughter witnessed pt collapse and began CPR at home.  EMS work w/ pt approx 1 hour before arrival. Per EMS, pt received  7 mg epi, 150 mg amniodrarone, 2 x .5 mg atropine, 1 amp bicarb, and 50 ml dopamine.  EMS sts that she was paced 4 times and given no shocks.  Pt arrived intubated.  IO in L leg placed by EMS.      159 CBG

## 2016-05-24 NOTE — Code Documentation (Signed)
Patient time of death occurred at 54

## 2016-05-24 NOTE — ED Provider Notes (Signed)
[facility] Emergency Department Provider Note   ____________________________________________  Time seen: Approximately 12:49 AM  I have reviewed the triage vital signs and the nursing notes.   HISTORY  Chief Complaint CPR in progress  Limited by unresponsiveness  HPI Hannah Hines is a 70 y.o. female brought to the ED from home via EMS as CPR in progress. Patient has a history of CAD, CVA whose family reports she had not been feeling well all day. Family was helping her transfer from her wheelchair when she collapsed. Upon EMS arrival, patient was pulseless without spontaneous respirations. EMS coated patient for over one hour prior to arrival to the emergency department. In total she received 7 epi, one bicarbonate, dopamine, 150 mg amiodarone bolus for a brief episode of ventricular tachycardia. She was intubated with ET tube in the field. EMS regained and lost pulses multiple times. She arrives to the ED pulseless with active CPR in progress.   Past Medical History  Diagnosis Date  . Multiple sclerosis (HCC) 1991  . Hypertension   . Stroke [facility]) 2015    questionable  . Aortic heart valve narrowing 08/19/2015  . CAD in native artery 08/19/2015  . Benign essential HTN 04/10/2014  . Leg weakness 07/11/2014  . Closed fracture of distal end of fibula with tibia 08/13/2015  . Decubitus ulcer of sacral area 08/13/2015  . 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 OP (osteoporosis) 04/10/2014  . Mixed incontinence 08/20/2015  . Muscle rigidity 05/02/2015  . 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 Pure hypercholesterolemia 04/10/2014  . Avitaminosis D 04/10/2014    Patient Active Problem List   Diagnosis Date Noted  . Mixed incontinence 08/20/2015  . Aortic heart valve narrowing 08/19/2015  . CAD in native artery 08/19/2015  . Closed fracture of distal end of fibula with tibia 08/13/2015  . Decubitus ulcer of sacral area 08/13/2015  . Muscle rigidity  05/02/2015  . 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 Leg weakness 07/11/2014  . Multiple sclerosis (HCC) 07/11/2014  . Benign essential HTN 04/10/2014  . OP (osteoporosis) 04/10/2014  . Pure hypercholesterolemia 04/10/2014  . Avitaminosis D 04/10/2014    Past Surgical History  Procedure Laterality Date  . Coronary stent placement N/A     Current Outpatient Rx  Name  Route  Sig  Dispense  Refill  . aspirin 81 MG tablet   Oral   Take 81 mg by mouth daily.         Marland Kitchen HYDROcodone-acetaminophen (NORCO/VICODIN) 5-325 MG tablet      Reported on 04/18/2016         . metoprolol succinate (TOPROL-XL) 25 MG 24 hr tablet   Oral   Take 25 mg by mouth.         . metoprolol tartrate (LOPRESSOR) 25 MG tablet   Oral   Take by mouth.         . Omega-3 1000 MG CAPS   Oral   Take by mouth.         . pravastatin (PRAVACHOL) 40 MG tablet               . silver sulfADIAZINE (SILVADENE) 1 % cream   Topical   Apply topically. Reported on 02/01/2016         . solifenacin (VESICARE) 5 MG tablet   Oral   Take 1 tablet (5 mg total) by mouth daily.   90 tablet   3   .  trimethoprim (TRIMPEX) 100 MG tablet   Oral   Take 1 tablet (100 mg total) by mouth daily.   90 tablet   3     Allergies Review of patient's allergies indicates no known allergies.  Family History  Problem Relation Age of Onset  . Bladder Cancer Neg Hx   . Prostate cancer Neg Hx   . Heart attack Father   . Diabetes Father     Social History Social History  Substance Use Topics  . Smoking status: Former Smoker -- 1.50 packs/day    Types: Cigarettes    Quit date: 04/25/1995  . Smokeless tobacco: Not on file  . Alcohol Use: Not on file    Review of Systems  Constitutional: Positive for generalized malaise. No fever/chills. Eyes: No visual changes. ENT: No sore throat. Cardiovascular: Denies chest pain. Respiratory: Denies shortness of  breath. Gastrointestinal: No abdominal pain.  No nausea, no vomiting.  No diarrhea.  No constipation. Genitourinary: Negative for dysuria. Musculoskeletal: Negative for back pain. Skin: Negative for rash. Neurological: Negative for headaches, focal weakness or numbness.  Limited by unresponsiveness; 10-point ROS otherwise negative.  ____________________________________________   PHYSICAL EXAM:  VITAL SIGNS: ED Triage Vitals  Enc Vitals Group     BP --      Pulse --      Resp --      Temp --      Temp src --      SpO2 --      Weight --      Height --      Head Cir --      Peak Flow --      Pain Score --      Pain Loc --      Pain Edu? --      Excl. in GC? --     Constitutional: Unresponsive. Mottled. Eyes: Pupils are fixed and dilated. Head: Atraumatic. Nose: No congestion/rhinnorhea. Mouth/Throat: ET tube in place. Neck: No stridor.   Cardiovascular: Palpable pulses with CPR.  Respiratory: No respiratory effort. Patient being bagged. Gastrointestinal: Soft and nontender. No distention. No abdominal bruits. No CVA tenderness. Musculoskeletal: Semi-rigid and contractured. Neurologic:  Unesponsive.  Skin:  Skin is mottled, cool, dry and intact. No rash noted. Psychiatric:Unable to assess.   ____________________________________________   LABS (all labs ordered are listed, but only abnormal results are displayed)  Labs Reviewed - No data to display ____________________________________________  EKG  None  ____________________________________________  RADIOLOGY  None  ____________________________________________   PROCEDURES  Procedure(s) performed: None  Critical Care performed: Yes, see critical care note(s)   CRITICAL CARE Performed by: Irean Hong   Total critical care time: <30 minutes  Critical care time was exclusive of separately billable procedures and treating other patients.  Critical care was necessary to treat or prevent imminent  or life-threatening deterioration.  Critical care was time spent personally by me on the following activities: development of treatment plan with patient and/or surrogate as well as nursing, discussions with consultants, evaluation of patient's response to treatment, examination of patient, obtaining history from patient or surrogate, ordering and performing treatments and interventions, ordering and review of laboratory studies, ordering and review of radiographic studies, pulse oximetry and re-evaluation of patient's condition.  ____________________________________________   INITIAL IMPRESSION / ASSESSMENT AND PLAN / ED COURSE  Pertinent labs & imaging results that were available during my care of the patient were reviewed by me and considered in my medical decision making (see chart for details).  70 year old female with a history of CAD and CVA who presents greater than 1 hour s/p witnessed arrest. Peripheral IV was placed by nursing staff. Additional epi, sodium bicarb, and 2 g of magnesium administered. Patient did not regain return of spontaneous circulation. By this time family members including her daughter were at bedside and requested discontinuation of CPR efforts. No pulse nor lung sounds auscultated. No palpable pulses. Time of death 11.  ____________________________________________   FINAL CLINICAL IMPRESSION(S) / ED DIAGNOSES  Final diagnoses:  Cardiopulmonary arrest (HCC)      NEW MEDICATIONS STARTED DURING THIS VISIT:  New Prescriptions   No medications on file     Note:  This document was prepared using Dragon voice recognition software and may include unintentional dictation errors.    Irean Hong, MD 2016-06-03 [phone]

## 2016-05-24 DEATH — deceased

## 2016-08-18 ENCOUNTER — Ambulatory Visit: Payer: Medicare Other
